# Patient Record
Sex: Female | Born: 2000 | Race: White | Hispanic: No | Marital: Married | Smoking: Never smoker
Health system: Southern US, Community
[De-identification: ages and names within clinical notes are randomized; demographics above are authoritative.]

## PROBLEM LIST (undated history)

## (undated) DIAGNOSIS — K5090 Crohn's disease, unspecified, without complications: Secondary | ICD-10-CM

## (undated) DIAGNOSIS — J45909 Unspecified asthma, uncomplicated: Secondary | ICD-10-CM

## (undated) DIAGNOSIS — F32A Depression, unspecified: Secondary | ICD-10-CM

## (undated) DIAGNOSIS — J302 Other seasonal allergic rhinitis: Secondary | ICD-10-CM

## (undated) DIAGNOSIS — H579 Unspecified disorder of eye and adnexa: Secondary | ICD-10-CM

## (undated) DIAGNOSIS — T7840XA Allergy, unspecified, initial encounter: Secondary | ICD-10-CM

## (undated) DIAGNOSIS — F419 Anxiety disorder, unspecified: Secondary | ICD-10-CM

## (undated) DIAGNOSIS — J45990 Exercise induced bronchospasm: Secondary | ICD-10-CM

## (undated) HISTORY — DX: Anxiety disorder, unspecified: F41.9

## (undated) HISTORY — DX: Other seasonal allergic rhinitis: J30.2

## (undated) HISTORY — PX: TOOTH EXTRACTION: SUR596

## (undated) HISTORY — DX: Depression, unspecified: F32.A

## (undated) HISTORY — DX: Exercise induced bronchospasm: J45.990

## (undated) HISTORY — DX: Unspecified disorder of eye and adnexa: H57.9

## (undated) HISTORY — DX: Unspecified asthma, uncomplicated: J45.909

## (undated) HISTORY — DX: Allergy, unspecified, initial encounter: T78.40XA

---
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
## 2019-01-23 ENCOUNTER — Other Ambulatory Visit: Payer: Self-pay

## 2019-01-23 DIAGNOSIS — Z20822 Contact with and (suspected) exposure to covid-19: Secondary | ICD-10-CM

## 2019-01-24 LAB — NOVEL CORONAVIRUS, NAA: SARS-CoV-2, NAA: NOT DETECTED

## 2019-01-25 ENCOUNTER — Telehealth: Payer: Self-pay | Admitting: *Deleted

## 2019-01-25 NOTE — Telephone Encounter (Signed)
Patient given negative covid results .

## 2019-02-06 ENCOUNTER — Ambulatory Visit: Payer: 59 | Attending: Internal Medicine

## 2019-02-06 DIAGNOSIS — Z20822 Contact with and (suspected) exposure to covid-19: Secondary | ICD-10-CM

## 2019-02-08 LAB — NOVEL CORONAVIRUS, NAA: SARS-CoV-2, NAA: NOT DETECTED

## 2019-04-28 ENCOUNTER — Ambulatory Visit: Payer: 59 | Attending: Internal Medicine

## 2019-04-28 DIAGNOSIS — Z23 Encounter for immunization: Secondary | ICD-10-CM

## 2019-04-28 NOTE — Progress Notes (Signed)
   Covid-19 Vaccination Clinic  Name:  Shelly Sanchez    MRN: [id number] DOB: 06-24-2000  04/28/2019  [name] was observed post Covid-19 immunization for 15 minutes without incident. She was provided with Vaccine Information Sheet and instruction to access the V-Safe system.   Ms. Common was instructed to call 911 with any severe reactions post vaccine: Marland Kitchen Difficulty breathing  . Swelling of face and throat  . A fast heartbeat  . A bad rash all over body  . Dizziness and weakness   Immunizations Administered    Name Date Dose VIS Date Route   Pfizer COVID-19 Vaccine 04/28/2019  9:28 AM 0.3 mL 01/20/2019 Intramuscular   Manufacturer: [place]   Lot: [id number]   [place]: [id number]

## 2019-05-23 ENCOUNTER — Ambulatory Visit: Payer: 59 | Attending: Internal Medicine

## 2019-05-23 DIAGNOSIS — Z23 Encounter for immunization: Secondary | ICD-10-CM

## 2019-05-23 NOTE — Progress Notes (Signed)
   Covid-19 Vaccination Clinic  Name:  ZEENAT JEANBAPTISTE    MRN: [id number] DOB: 2001/02/06  05/23/2019  Ms. Anzalone was observed post Covid-19 immunization for 15 minutes without incident. She was provided with Vaccine Information Sheet and instruction to access the V-Safe system.   Ms. Volkov was instructed to call 911 with any severe reactions post vaccine: Marland Kitchen Difficulty breathing  . Swelling of face and throat  . A fast heartbeat  . A bad rash all over body  . Dizziness and weakness   Immunizations Administered    Name Date Dose VIS Date Route   Pfizer COVID-19 Vaccine 05/23/2019 10:10 AM 0.3 mL 01/20/2019 Intramuscular   Manufacturer: [place]   Lot: [id number]   [place]: [id number]

## 2019-09-06 ENCOUNTER — Encounter: Payer: Self-pay | Admitting: Internal Medicine

## 2019-09-06 ENCOUNTER — Other Ambulatory Visit: Payer: Self-pay

## 2019-09-06 ENCOUNTER — Telehealth (INDEPENDENT_AMBULATORY_CARE_PROVIDER_SITE_OTHER): Payer: 59 | Admitting: Internal Medicine

## 2019-09-06 VITALS — Ht 62.0 in | Wt 155.0 lb

## 2019-09-06 DIAGNOSIS — K5090 Crohn's disease, unspecified, without complications: Secondary | ICD-10-CM

## 2019-09-06 DIAGNOSIS — F418 Other specified anxiety disorders: Secondary | ICD-10-CM | POA: Diagnosis not present

## 2019-09-06 NOTE — Progress Notes (Signed)
Virtual Visit via Video Note  I connected with@ on 09/06/19 at  9:30 AM EDT by a video enabled telemedicine application and verified that I am speaking with the correct person using two identifiers. Location patient: home Location provider:work office Persons participating in the virtual visit: patient, provider  [place] national recommendations  regarding COVID 19 pandemic   video visit is advised over in office visit for this patient.  Patient aware  of the limitations of evaluation and management by telemedicine and  availability of in person appointments. and agreed to proceed.   HPI: Shelly Sanchez presents for video visit about increasing anxiety related to her health  She was hospitalized for severe life threatening illness 2019   [place]   Has Crohn in remission  Has had the covid vaccine x 2 but had sever anxiety reaction  About putting  Med substances  in her body  With the injections.   Her sx seem to be escalating  And fear for  Health reactions   Back to school this fall . [employer]  There are counselors on campus.  Health otherwise stable  Did have counseling soon after  Hospitalization    ROS: See pertinent positives and negatives per HPI.  Past Medical History:  Diagnosis Date  . Crohn disease ([place])   . Exercise induced bronchospasm    inhaler prn.   . 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 Seasonal allergies     History reviewed. No pertinent surgical history.  Family History  Problem Relation Age of Onset  . ADD / ADHD Other        brother and father  . Diabetes Other   . Scoliosis Other   . Other Sister        browns syndrome  . Seizures Sister        occipital pcs  . Hashimoto's thyroiditis Sister     Social History   Tobacco Use  . Smoking status: Never Smoker  . Smokeless tobacco: Never Used  Vaping Use  . Vaping Use: Never used  Substance Use Topics  . Alcohol use: Not Currently  . Drug use: Never       Current Outpatient Medications:  .  acetaminophen (TYLENOL) 325 MG tablet, Take by mouth., Disp: , Rfl:  .  fluticasone (FLONASE) 50 MCG/ACT nasal spray, Place 1 spray into both nostrils as needed. , Disp: , Rfl:  .  loratadine-pseudoephedrine (CLARITIN-D 12-HOUR) 5-120 MG tablet, Take 1 tablet by mouth 2 (two) times daily as needed for allergies., Disp: , Rfl:  .  Melatonin 10 MG CAPS, Take by mouth., Disp: , Rfl:  .  Multiple Vitamins-Minerals ([employer] WOMENS DAILY GUMMIES) CHEW, Chew 2 tablets by mouth daily., Disp: , Rfl:  .  PROAIR HFA 108 (90 Base) MCG/ACT inhaler, USE 2 PUFFS BEFORE EXERCISE OR EVERY 6 HOURS AS NEEDED, Disp: 1 Inhaler, Rfl: 0 .  amoxicillin-clavulanate (AUGMENTIN) 875-125 MG tablet, Take 1 tablet by mouth every 12 (twelve) hours., Disp: 14 tablet, Rfl: 0 .  Pediatric Multiple Vit-C-FA (CHEWABLE VITE CHILDRENS) CHEW, Chew 1 tablet by mouth daily. (Patient not taking: Reported on 09/06/2019), Disp: , Rfl:   EXAM: BP Readings from Last 3 Encounters:  02/15/18 120/68  08/17/17 99/67  06/19/17 96/64    VITALS per patient if applicable:  GENERAL: alert, oriented, appears well and in no acute distress  HEENT: atraumatic, conjunttiva clear, no obvious abnormalities on inspection of  external nose and ears  NECK: normal movements of the head and neck  LUNGS: on inspection no signs of respiratory distress, breathing rate appears normal, no obvious gross SOB, gasping or wheezing  CV: no obvious cyanosis  MS: moves all visible extremities without noticeable abnormality  PSYCH/NEURO: pleasant and cooperative, no obvious depression or anxiety, speech and thought processing grossly intact Lab Results  Component Value Date   WBC 7.6 07/18/2018   HGB 14.4 07/18/2018   HCT 44 07/18/2018   PLT 202 07/18/2018   GLUCOSE 95 08/14/2014   CHOL 176 08/14/2014   TRIG 127.0 08/14/2014   HDL 47.30 08/14/2014   LDLCALC 103 (H) 08/14/2014   ALT 16 07/18/2018   AST 18  07/18/2018   NA 138 07/18/2018   K 4.6 07/18/2018   CL 103 08/14/2014   CREATININE 0.8 07/18/2018   BUN 11 07/18/2018   CO2 29 08/14/2014   TSH 1.29 08/14/2014    ASSESSMENT AND PLAN:  Discussed the following assessment and plan:    ICD-10-CM   1. Situational anxiety  F41.8   2. Crohn's disease in remission ([place])  K50.90   sounds like ptsd  Catastrophic   Thinking  Understandable after   Life threatening  Illness and hospitalization   Livable events . At this time dont concern about a booster  Or the flu vaccine . Glad she already got the covid vaccine . Set up counseling  On campus ( school starts next month)   Counseled.  She Will set  Up with counseling for fall smemster  Counseling center [employer]  And get back with me either message or vVV after establised   Will s upport  if need medicaiton help  While in therapy.  CBT agree .  Consider medication  If indicated   Expectant management and discussion of plan and treatment with opportunity to ask questions and all were answered. The patient agreed with the plan and demonstrated an understanding of the instructions.   Advised to call back or seek an in-person evaluation if worsening  or having  further concerns . Return for after counselingi n school or as needed if medication  indicated .  32 minutes  Review and  Counseling planning   Shanon Ace, MD

## 2019-09-25 IMAGING — MR MR [PERSON_NAME] ABD W/CM
15 series · 48 of 48 positions shown · IV contrast (multihance)
Comparison: None.

CLINICAL DATA: Crohn's disease. Lower abdominal, pelvic, and back
pain. Bloating.

EXAM:
MR ABDOMEN AND PELVIS WITHOUT AND WITH CONTRAST (MR ENTEROGRAPHY)
TECHNIQUE: Multiplanar, multisequence MRI of the abdomen and pelvis was
performed both before and during bolus administration of intravenous
contrast. Negative oral contrast VoLumen was given.
CONTRAST:  12mL MULTIHANCE GADOBENATE DIMEGLUMINE 529 MG/ML IV SOLN

[Series 3: T2 · coronal · 4.0mm · 1.56mm/px · 2 of 38 slices shown (1 of 2)]
[im 1/38]
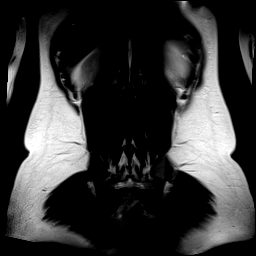
[im 38/38]
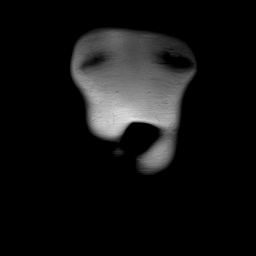

[Series 4: T2 · axial · 6.0mm · 1.22mm/px · z∈[-184,+184]mm · 3 of 50 slices shown (2 of 2)]
[im 1/50]
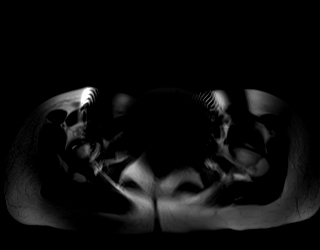
[im 25/50]
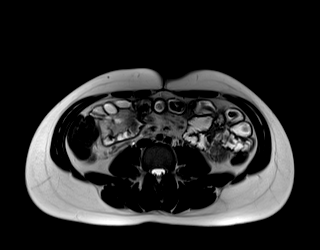
[im 50/50]
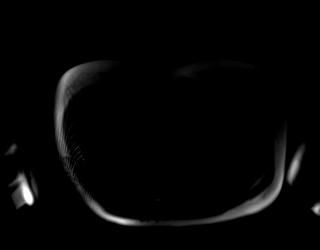

[Series 5: DWI · axial · 6.0mm · 1.42mm/px · z∈[-184,+184]mm · 7 of 150 slices shown (1 of 2)]
[im 1/150]
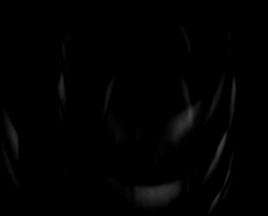
[im 25/150]
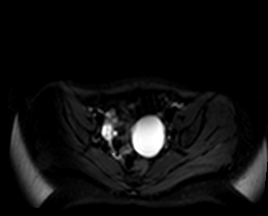
[im 50/150]
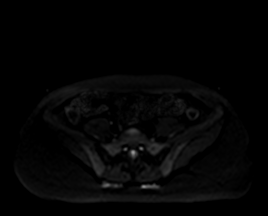
[im 75/150]
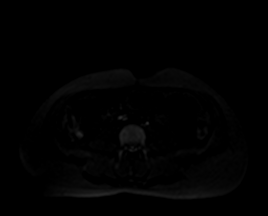
[im 100/150]
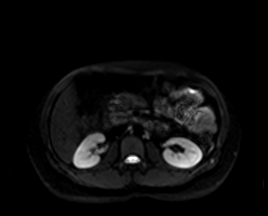
[im 125/150]
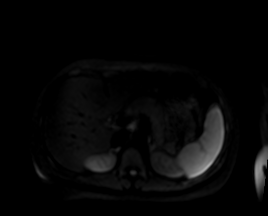
[im 150/150]
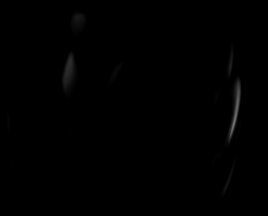

[Series 6: DWI · axial · 6.0mm · 1.42mm/px · z∈[-184,+184]mm · 3 of 50 slices shown (2 of 2)]
[im 1/50]
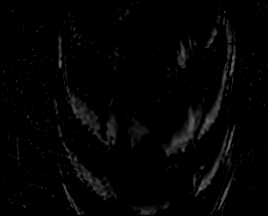
[im 25/50]
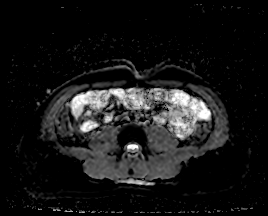
[im 50/50]
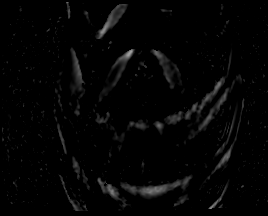

[Series 7: bSSFP · coronal · 4.0mm · 1.25mm/px · 1 of 38 slices shown]
[im 1/38]
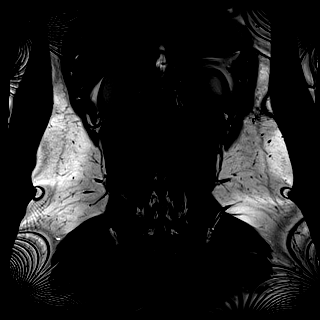

[Series 8: T1 dynamic · axial · non-contrast · 4.0mm · 1.25mm/px · z∈[-174,+174]mm · 3 of 88 slices shown]
[im 1/88]
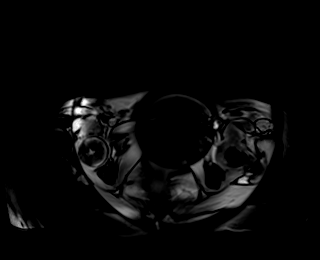
[im 44/88]
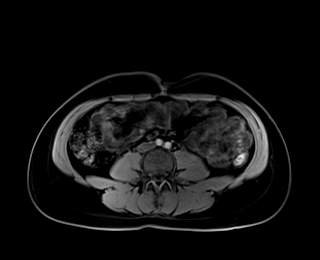
[im 88/88]
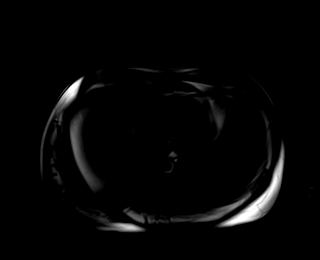

[Series 9: T1 dynamic post-contrast · axial · 4.0mm · 1.25mm/px · z∈[-174,+174]mm · 3 of 88 slices shown (1 of 9)]
[im 1/88]
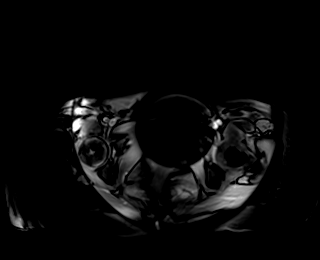
[im 44/88]
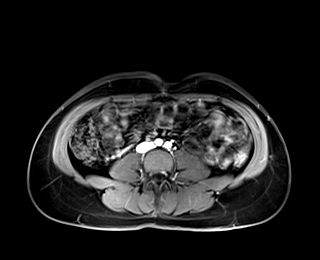
[im 88/88]
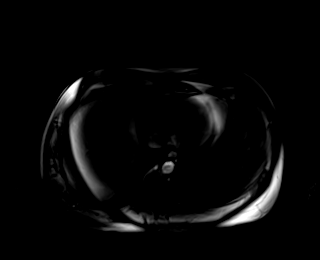

[Series 10: T1 dynamic post-contrast · axial · 4.0mm · 1.25mm/px · z∈[-174,+174]mm · 3 of 88 slices shown (2 of 9)]
[im 1/88]
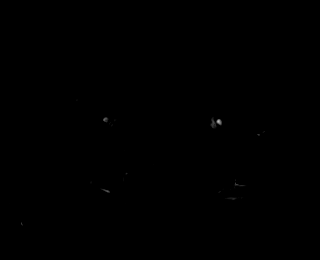
[im 44/88]
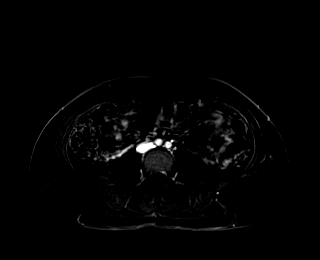
[im 88/88]
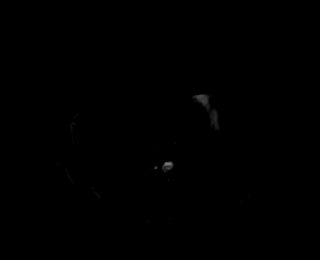

[Series 11: T1 dynamic post-contrast · axial · 4.0mm · 1.25mm/px · z∈[-174,+174]mm · 3 of 88 slices shown (3 of 9)]
[im 1/88]
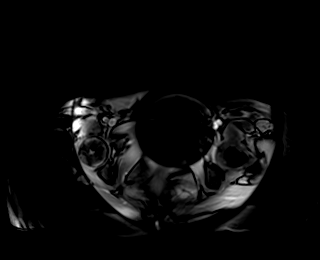
[im 44/88]
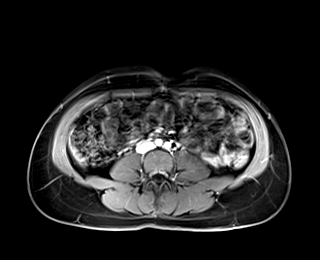
[im 88/88]
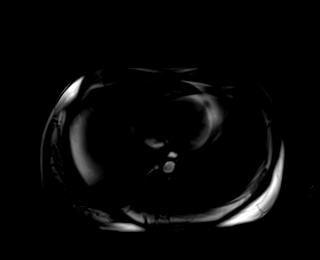

[Series 12: T1 dynamic post-contrast · axial · 4.0mm · 1.25mm/px · z∈[-174,+174]mm · 3 of 88 slices shown (4 of 9)]
[im 1/88]
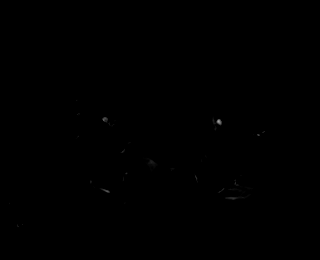
[im 44/88]
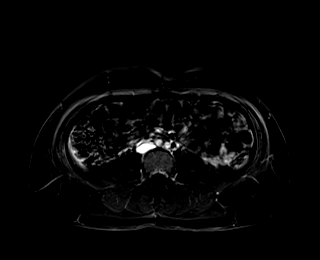
[im 88/88]
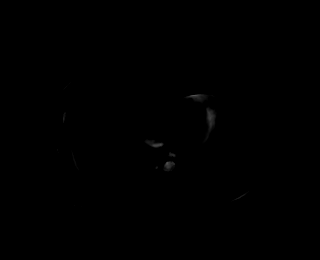

[Series 13: T1 dynamic post-contrast · axial · 4.0mm · 1.25mm/px · z∈[-174,+174]mm · 3 of 88 slices shown (5 of 9)]
[im 1/88]
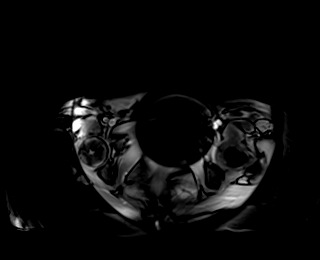
[im 44/88]
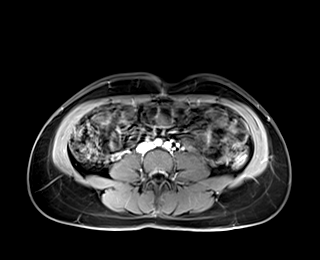
[im 88/88]
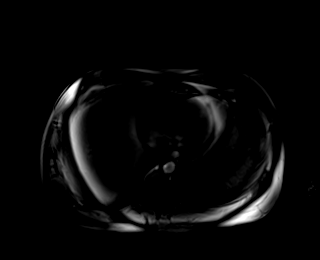

[Series 14: T1 dynamic post-contrast · axial · 4.0mm · 1.25mm/px · z∈[-174,+174]mm · 3 of 88 slices shown (6 of 9)]
[im 1/88]
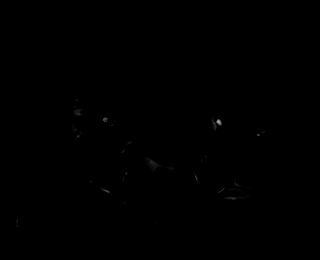
[im 44/88]
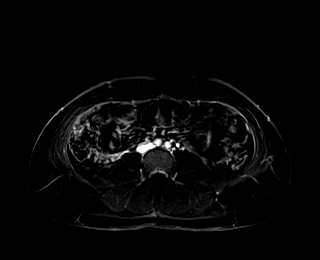
[im 88/88]
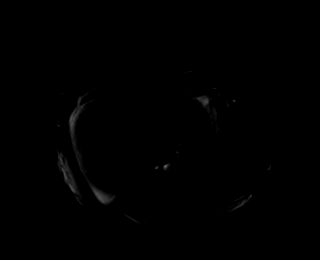

[Series 15: T1 dynamic post-contrast · coronal · 3.0mm · 1.38mm/px · 5 of 144 slices shown (7 of 9)]
[im 1/144]
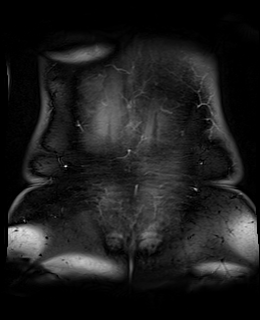
[im 36/144]
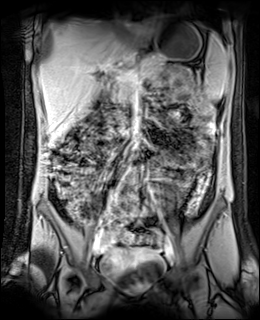
[im 72/144]
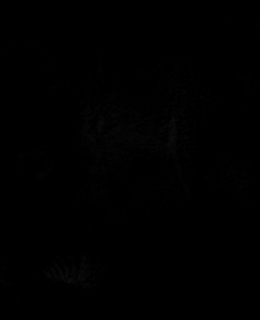
[im 108/144]
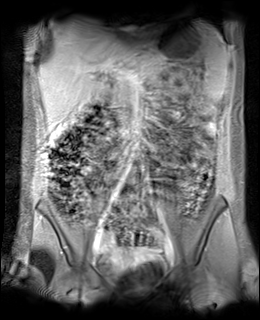
[im 144/144]
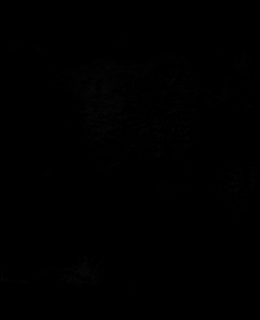

[Series 16: T1 dynamic post-contrast · axial · 4.0mm · 1.25mm/px · z∈[-174,+174]mm · 3 of 88 slices shown (8 of 9)]
[im 1/88]
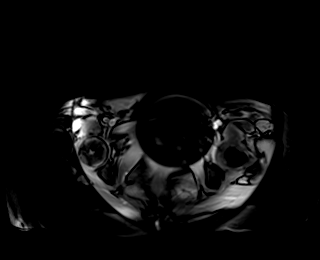
[im 44/88]
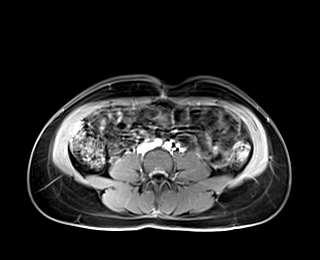
[im 88/88]
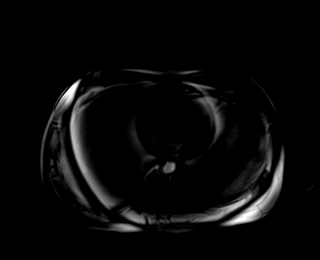

[Series 17: T1 dynamic post-contrast · axial · 4.0mm · 1.25mm/px · z∈[-174,+174]mm · 3 of 88 slices shown (9 of 9)]
[im 1/88]
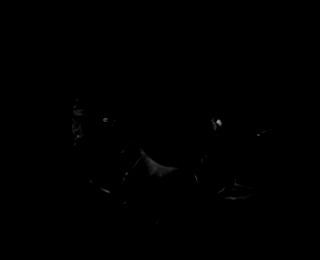
[im 44/88]
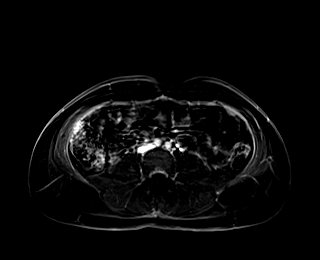
[im 88/88]
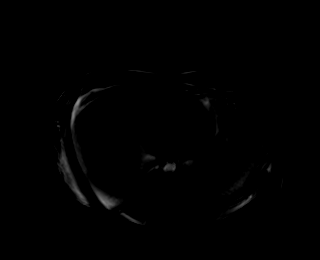

[48 of 48 positions shown; findings below may reference images not displayed]

FINDINGS: COMBINED FINDINGS FOR BOTH MR ABDOMEN AND PELVIS

Lower Chest: No acute findings.

Hepatobiliary: No hepatic masses identified. Gallbladder is
unremarkable. No evidence of biliary ductal dilatation.

Pancreas:  No mass or inflammatory changes.

Spleen: Within normal limits in size and appearance.

Adrenals/Urinary Tract: No masses identified. No evidence of
hydronephrosis.

Stomach/Bowel: No evidence of small bowel wall thickening, abnormal
contrast enhancement, or dilatation. No evidence of mesenteric
inflammatory changes, enteric fistula, or abnormal fluid
collections. The terminal ileum is normal in appearance. No other
areas of bowel wall thickening identified.

Vascular/Lymphatic: No pathologically enlarged lymph nodes. No
abdominal aortic aneurysm.

Reproductive: Normal appearance of uterus. A simple appearing cyst
is seen in the left adnexa which measures 5.1 x 4.3 cm, and has
benign characteristics. No other pelvic mass identified. No evidence
of inflammatory changes or free fluid.

Other:  None.

Musculoskeletal:  No suspicious bone lesions identified.
IMPRESSION: No radiographic evidence of inflammatory bowel disease.

5.1 cm benign-appearing left adnexal cyst, likely physiologic in a
reproductive age female. Given size of this cyst, follow-up by
transvaginal pelvic ultrasound is recommended in 6-12 weeks. This
recommendation follows ACR consensus guidelines: White Paper of the
ACR Incidental Findings Committee II on Adnexal Findings. [HOSPITAL] [DATE].

## 2019-11-28 ENCOUNTER — Telehealth (INDEPENDENT_AMBULATORY_CARE_PROVIDER_SITE_OTHER): Payer: 59 | Admitting: Internal Medicine

## 2019-11-28 ENCOUNTER — Encounter: Payer: Self-pay | Admitting: Internal Medicine

## 2019-11-28 DIAGNOSIS — Z2089 Contact with and (suspected) exposure to other communicable diseases: Secondary | ICD-10-CM

## 2019-11-28 DIAGNOSIS — Z207 Contact with and (suspected) exposure to pediculosis, acariasis and other infestations: Secondary | ICD-10-CM

## 2019-11-28 MED ORDER — PERMETHRIN 5 % EX CREA
TOPICAL_CREAM | CUTANEOUS | 1 refills | Status: DC
Start: 2019-11-28 — End: 2020-04-16

## 2019-11-28 NOTE — Progress Notes (Signed)
Virtual Visit via Video Note  I connected with@ on 11/28/19 at  4:00 PM EDT by a video enabled telemedicine application and verified that I am speaking with the correct person using two identifiers. Location patient: school  Location provider:work office Persons participating in the virtual visit: patient, provider  [employer] national recommendations  regarding COVID 19 pandemic   video visit is advised over in office visit for this patient.  Patient aware  of the limitations of evaluation and management by telemedicine and  availability of in person appointments. and agreed to proceed.   HPI: Shelly Sanchez presents for video visit Was close contact with family  Over break  October 9-11   Now diagnosis ed wiuth scabies  Baby and more in family  In last few days and luna and father and  All had itching and  Scabies.  Family has been rx with Elimite but she has not  She has no itching  All over  Or rash  Some itching ankles walking through grass on campus.  Not sure if bf exposed   Can we also give enough for him in case ?     ROS: See pertinent positives and negatives per HPI.  Past Medical History:  Diagnosis Date  . Crohn disease ([place])   . Exercise induced bronchospasm    inhaler prn.   . 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 Seasonal allergies     History reviewed. No pertinent surgical history.  Family History  Problem Relation Age of Onset  . ADD / ADHD Other        brother and father  . Diabetes Other   . Scoliosis Other   . Other Sister        browns syndrome  . Seizures Sister        occipital pcs  . Hashimoto's thyroiditis Sister     Social History   Tobacco Use  . Smoking status: Never Smoker  . Smokeless tobacco: Never Used  Vaping Use  . Vaping Use: Never used  Substance Use Topics  . Alcohol use: Not Currently  . Drug use: Never      Current Outpatient Medications:  .  acetaminophen (TYLENOL) 325 MG tablet, Take  by mouth., Disp: , Rfl:  .  loratadine-pseudoephedrine (CLARITIN-D 12-HOUR) 5-120 MG tablet, Take 1 tablet by mouth 2 (two) times daily as needed for allergies., Disp: , Rfl:  .  Melatonin 10 MG CAPS, Take by mouth., Disp: , Rfl:  .  Multiple Vitamins-Minerals ([employer] WOMENS DAILY GUMMIES) CHEW, Chew 2 tablets by mouth daily., Disp: , Rfl:  .  fluticasone (FLONASE) 50 MCG/ACT nasal spray, Place 1 spray into both nostrils as needed.  (Patient not taking: Reported on 11/28/2019), Disp: , Rfl:  .  permethrin (ELIMITE) 5 % cream, Apply 5% cream to entire body from the neck down,  wash after 8 to 14 hours can repeat in 14 days, Disp: 120 g, Rfl: 1 .  PROAIR HFA 108 (90 Base) MCG/ACT inhaler, USE 2 PUFFS BEFORE EXERCISE OR EVERY 6 HOURS AS NEEDED (Patient not taking: Reported on 11/28/2019), Disp: 1 Inhaler, Rfl: 0  EXAM: BP Readings from Last 3 Encounters:  02/15/18 120/68  08/17/17 99/67  06/19/17 96/64    VITALS per patient if applicable:  GENERAL: alert, oriented, appears well and in no acute distress HEENT: atraumatic, conjunttiva clear, no obvious abnormalities on inspection of external nose and ears  NECK: normal movements of the head and neck LUNGS: on inspection no signs of respiratory distress, breathing rate appears normal, no obvious gross SOB, gasping or wheezing CV: no obvious cyanosis PSYCH/NEURO: pleasant and cooperative, no obvious depression or anxiety, speech and thought processing grossly intact Lab Results  Component Value Date   WBC 7.6 07/18/2018   HGB 14.4 07/18/2018   HCT 44 07/18/2018   PLT 202 07/18/2018   GLUCOSE 95 08/14/2014   CHOL 176 08/14/2014   TRIG 127.0 08/14/2014   HDL 47.30 08/14/2014   LDLCALC 103 (H) 08/14/2014   ALT 16 07/18/2018   AST 18 07/18/2018   NA 138 07/18/2018   K 4.6 07/18/2018   CL 103 08/14/2014   CREATININE 0.8 07/18/2018   BUN 11 07/18/2018   CO2 29 08/14/2014   TSH 1.29 08/14/2014    ASSESSMENT AND PLAN:  Discussed the  following assessment and plan:    ICD-10-CM   1. Scabies exposure  Z20.89     Counseled.  agreee with prophylactic rx   Refill given  Enough for  contact  Expectant management and discussion of plan and treatment with opportunity to ask questions and all were answered. The patient agreed with the plan and demonstrated an understanding of the instructions.  rx send to  pharmacy in [place] to call back or seek an in-person evaluation if worsening  or having  further concerns . Return for as needed . Marland Kitchen   Shanon Ace, MD

## 2020-02-20 ENCOUNTER — Encounter: Payer: Self-pay | Admitting: Internal Medicine

## 2020-02-20 NOTE — Progress Notes (Signed)
Quantiferon TB gold plus-negative on 01/29/2020-per fax from [facility].

## 2020-04-16 ENCOUNTER — Other Ambulatory Visit: Payer: Self-pay

## 2020-04-16 ENCOUNTER — Encounter: Payer: Self-pay | Admitting: Internal Medicine

## 2020-04-16 ENCOUNTER — Telehealth: Payer: 59 | Admitting: Internal Medicine

## 2020-04-16 VITALS — Ht 62.0 in | Wt 180.0 lb

## 2020-04-16 DIAGNOSIS — Z8616 Personal history of COVID-19: Secondary | ICD-10-CM

## 2020-04-16 DIAGNOSIS — J329 Chronic sinusitis, unspecified: Secondary | ICD-10-CM | POA: Diagnosis not present

## 2020-04-16 DIAGNOSIS — J3489 Other specified disorders of nose and nasal sinuses: Secondary | ICD-10-CM

## 2020-04-16 MED ORDER — AMOXICILLIN-POT CLAVULANATE 875-125 MG PO TABS: 1.0000 | ORAL_TABLET | Freq: Two times a day (BID) | ORAL | 0 refills | Status: DC

## 2020-04-16 NOTE — Progress Notes (Signed)
Virtual Visit via Video Note  I connected with@ on 04/16/20 at  1:30 PM EST by a video enabled telemedicine application and verified that I am speaking with the correct person using two identifiers. Location patient: home Location provider:work  office Persons participating in the virtual visit: patient, provider  [employer] national recommendations  regarding COVID 19 pandemic   video visit is advised over in office visit for this patient.  Patient aware  of the limitations of evaluation and management by telemedicine and  availability of in person appointments. and agreed to proceed.   HPI: Shelly Sanchez presents for video visit after having COVID-19 infection early in January 2022 is had continued facial sinus pressure that is unrelenting.  She was expecting it to left after using saline Afrin other over-the-counter's Claritin without help.   Initial Covid symptoms were relentless congestion and no cough    .    Fever and fatigue .   Taste and smell loss recoveredabout a month back    Describes severe pressure in the triangle of her face over maxillary and ethmoid areas.  Some drainage especially in the morning but not a lot otherwise no bleeding fever or chills equilibrium mess ed  Up no falling but a little bit dizzy. Taking hydroxizine    And vitamin s Hearing feels     clogged and   Vision ok .  Past history of sinusitis. May have some seasonal allergies but not recently has not been using Flonase recently. ROS: See pertinent positives and negatives per HPI.  Past Medical History:  Diagnosis Date  . Crohn disease ([place])   . Exercise induced bronchospasm    inhaler prn.   . 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 Seasonal allergies     History reviewed. No pertinent surgical history.  Family History  Problem Relation Age of Onset  . ADD / ADHD Other        brother and father  . Diabetes Other   . Scoliosis Other   . Other Sister         browns syndrome  . Seizures Sister        occipital pcs  . Hashimoto's thyroiditis Sister     Social History   Tobacco Use  . Smoking status: Never Smoker  . Smokeless tobacco: Never Used  Vaping Use  . Vaping Use: Never used  Substance Use Topics  . Alcohol use: Not Currently  . Drug use: Never      Current Outpatient Medications:  .  acetaminophen (TYLENOL) 325 MG tablet, Take by mouth., Disp: , Rfl:  .  amoxicillin-clavulanate (AUGMENTIN) 875-125 MG tablet, Take 1 tablet by mouth every 12 (twelve) hours. For sinusitis, Disp: 14 tablet, Rfl: 0 .  Cholecalciferol (VITAMIN D) 50 MCG (2000 UT) tablet, , Disp: , Rfl:  .  Cranberry-Vitamin C (CRANBERRY CONCENTRATE/VITAMINC) 15000-100 MG CAPS, , Disp: , Rfl:  .  hydrOXYzine (VISTARIL) 25 MG capsule, Take 25 mg by mouth 2 (two) times daily as needed., Disp: , Rfl:  .  loratadine-pseudoephedrine (CLARITIN-D 12-HOUR) 5-120 MG tablet, Take 1 tablet by mouth 2 (two) times daily as needed for allergies., Disp: , Rfl:  .  Magnesium 400 MG CAPS, , Disp: , Rfl:  .  Melatonin 10 MG CAPS, Take by mouth., Disp: , Rfl:  .  Multiple Vitamins-Minerals ([employer] WOMENS DAILY GUMMIES) CHEW, Chew 2 tablets by mouth daily., Disp: , Rfl:  .  Omega-3 Fatty Acids (FISH OIL MAXIMUM STRENGTH PO), , Disp: , Rfl:  .  Vitamin E 180 MG (400 UNIT) CAPS, , Disp: , Rfl:  .  fluticasone (FLONASE) 50 MCG/ACT nasal spray, Place 1 spray into both nostrils as needed.  (Patient not taking: Reported on 04/16/2020), Disp: , Rfl:  .  PROAIR HFA 108 (90 Base) MCG/ACT inhaler, USE 2 PUFFS BEFORE EXERCISE OR EVERY 6 HOURS AS NEEDED (Patient not taking: No sig reported), Disp: 1 Inhaler, Rfl: 0  EXAM: BP Readings from Last 3 Encounters:  02/15/18 120/68  08/17/17 99/67  06/19/17 96/64    VITALS per patient if applicable:  GENERAL: alert, oriented, appears well and in no acute distress looks mildly congested but no cough sneezing during exam no facial edema.  HEENT:  atraumatic, conjunttiva clear, no obvious abnormalities on inspection of external nose and ears Points to areas maxillary ethmoid as area of discomfort. NECK: normal movements of the head and neck  LUNGS: on inspection no signs of respiratory distress, breathing rate appears normal, no obvious gross SOB, gasping or wheezing  CV: no obvious cyanosis  MS: moves all visible extremities without noticeable abnormality  PSYCH/NEURO: pleasant and cooperative, no obvious depression or anxiety, speech and thought processing grossly intact Lab Results  Component Value Date   WBC 7.6 07/18/2018   HGB 14.4 07/18/2018   HCT 44 07/18/2018   PLT 202 07/18/2018   GLUCOSE 95 08/14/2014   CHOL 176 08/14/2014   TRIG 127.0 08/14/2014   HDL 47.30 08/14/2014   LDLCALC 103 (H) 08/14/2014   ALT 16 07/18/2018   AST 18 07/18/2018   NA 138 07/18/2018   K 4.6 07/18/2018   CL 103 08/14/2014   CREATININE 0.8 07/18/2018   BUN 11 07/18/2018   CO2 29 08/14/2014   TSH 1.29 08/14/2014    ASSESSMENT AND PLAN:  Discussed the following assessment and plan:    ICD-10-CM   1. Sinus pressure  J34.89   2. Sinusitis, unspecified chronicity, unspecified location  J32.9   3. History of COVID-19  Z86.16     Counseled.  Medicines may contribute to headache wooziness however persistent localized sinus symptoms after recovery from other from Covid 2 months ago it is reasonable to add antibiotic at this time.  Suggest try adding the Flonase because of allergy season and the saline. Follow-up information if persistent progressive despite treatment.  Expectant management and discussion of plan and treatment with opportunity to ask questions and all were answered. The patient agreed with the plan and demonstrated an understanding of the instructions.   Advised to call back or seek an in-person evaluation if worsening  or having  further concerns . Return if symptoms worsen or fail to improve as expected.    Shanon Ace, MD

## 2020-04-16 NOTE — Progress Notes (Signed)
Onset of symptoms 2 months ago and Patient was positive for Covid then. Sinus pressure started then but has not gone away.

## 2020-07-18 DIAGNOSIS — J0391 Acute recurrent tonsillitis, unspecified: Secondary | ICD-10-CM | POA: Insufficient documentation

## 2020-12-11 LAB — HEPATIC FUNCTION PANEL
Alkaline Phosphatase: 124 (ref 25–125)
Bilirubin, Total: 0.6

## 2020-12-11 LAB — BASIC METABOLIC PANEL
BUN: 13 (ref 4–21)
CO2: 28 — AB (ref 13–22)
Chloride: 101 (ref 99–108)
Creatinine: 0.7 (ref 0.5–1.1)
Glucose: 86
Potassium: 4 (ref 3.4–5.3)
Sodium: 137 (ref 137–147)

## 2020-12-11 LAB — COMPREHENSIVE METABOLIC PANEL
Albumin: 4.5 (ref 3.5–5.0)
Calcium: 9.2 (ref 8.7–10.7)

## 2020-12-11 LAB — CBC AND DIFFERENTIAL
Neutrophils Absolute: 6.1
Platelets: 237 (ref 150–399)

## 2020-12-17 ENCOUNTER — Encounter: Payer: Self-pay | Admitting: Internal Medicine

## 2020-12-18 NOTE — Telephone Encounter (Signed)
What CO2 you are discussing ?  t appears that you had a chemistry panel with a mildly elevated CO2 which is a calculated acid-base level and has very little to do with your breathing CO2. You may want to ask whoever ordered the test also but I think it is clinically insignificant From what I can tell.

## 2021-01-10 LAB — HEPATIC FUNCTION PANEL
ALT: 22 (ref 7–35)
AST: 16 (ref 13–35)
Alkaline Phosphatase: 111 (ref 25–125)
Bilirubin, Total: 0.4

## 2021-01-10 LAB — BASIC METABOLIC PANEL
BUN: 11 (ref 4–21)
CO2: 26 — AB (ref 13–22)
Chloride: 106 (ref 99–108)
Glucose: 84
Potassium: 4.3 (ref 3.4–5.3)
Sodium: 142 (ref 137–147)

## 2021-01-10 LAB — COMPREHENSIVE METABOLIC PANEL
Albumin: 4.6 (ref 3.5–5.0)
Calcium: 9.6 (ref 8.7–10.7)

## 2021-01-10 LAB — CBC: RBC: 5.17 — AB (ref 3.87–5.11)

## 2021-01-10 LAB — CBC AND DIFFERENTIAL
HCT: 14 — AB (ref 36–46)
WBC: 9.1

## 2021-01-28 ENCOUNTER — Encounter: Payer: Self-pay | Admitting: Internal Medicine

## 2021-01-29 ENCOUNTER — Encounter: Payer: Self-pay | Admitting: Internal Medicine

## 2021-02-07 NOTE — Telephone Encounter (Signed)
No serious abnormality on lab   I dont see anemia   and no sig worries  Just keep upcoming appt woth /s

## 2021-02-12 ENCOUNTER — Encounter: Payer: Self-pay | Admitting: Internal Medicine

## 2021-02-12 ENCOUNTER — Telehealth: Payer: Self-pay

## 2021-02-12 ENCOUNTER — Telehealth (INDEPENDENT_AMBULATORY_CARE_PROVIDER_SITE_OTHER): Payer: 59 | Admitting: Internal Medicine

## 2021-02-12 DIAGNOSIS — Z8616 Personal history of COVID-19: Secondary | ICD-10-CM

## 2021-02-12 DIAGNOSIS — M2550 Pain in unspecified joint: Secondary | ICD-10-CM | POA: Diagnosis not present

## 2021-02-12 DIAGNOSIS — K5090 Crohn's disease, unspecified, without complications: Secondary | ICD-10-CM

## 2021-02-12 DIAGNOSIS — R4189 Other symptoms and signs involving cognitive functions and awareness: Secondary | ICD-10-CM

## 2021-02-12 DIAGNOSIS — R7982 Elevated C-reactive protein (CRP): Secondary | ICD-10-CM

## 2021-02-12 DIAGNOSIS — R748 Abnormal levels of other serum enzymes: Secondary | ICD-10-CM

## 2021-02-12 NOTE — Progress Notes (Addendum)
Virtual Visit via Video Note  I connected with [place] on 02/12/21 at 12:30 PM EST by a video enabled telemedicine application and verified that I am speaking with the correct person using two identifiers. Location patient: home Location provider:work  office Persons participating in the virtual visit: patient, provider  [place] national recommendations  regarding COVID 19 pandemic   video visit is advised over in office visit for this patient.  Patient aware  of the limitations of evaluation and management by telemedicine and  availability of in person appointments. and agreed to proceed.   HPI: Shelly Sanchez presents for video visit technical difficulties change this to a telephone visit.  After attempting to do connection for 20 minutes. She is under care from pediatric GI [facility] for Crohn's disease which is in remission.  She has had COVID about a year ago and feels that she has had concentration brain fog difficulty since that time.  She has seen a psychiatrist put on SSRI without help. Her GI team has followed an elevated CRP and at this time feels that her Crohn's is quiescent and there needs to be evaluation for other causes of her lab abnormalities and the way she feels.  She does endorse joint pains without swelling but no fevers.  Gets red cheeks after she eats but no itching or swelling. Takes B12 last B12 level was a year ago. Denies symptoms of sleep apnea does have awakenings but not told she snores or stops breathing.   ROS: See pertinent positives and negatives per HPI.  Past Medical History:  Diagnosis Date   Crohn disease ([place])    Exercise induced bronchospasm    inhaler prn.    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 No pertinent surgical history.  Family History  Problem Relation Age of Onset   ADD / ADHD Other        brother and father   Diabetes Other     Scoliosis Other    Other Sister        browns syndrome   Seizures Sister        occipital pcs   Hashimoto's thyroiditis Sister     Social History   Tobacco Use   Smoking status: Never   Smokeless tobacco: Never  Vaping Use   Vaping Use: Never used  Substance Use Topics   Alcohol use: Not Currently   Drug use: Never      Current Outpatient Medications:    acetaminophen (TYLENOL) 325 MG tablet, Take by mouth., Disp: , Rfl:    amoxicillin-clavulanate (AUGMENTIN) 875-125 MG tablet, Take 1 tablet by mouth every 12 (twelve) hours. For sinusitis, Disp: 14 tablet, Rfl: 0   Cholecalciferol (VITAMIN D) 50 MCG (2000 UT) tablet, , Disp: , Rfl:    Cranberry-Vitamin C (CRANBERRY CONCENTRATE/VITAMINC) 15000-100 MG CAPS, , Disp: , Rfl:    fluticasone (FLONASE) 50 MCG/ACT nasal spray, Place 1 spray into both nostrils as needed.  (Patient not taking: Reported on 04/16/2020), Disp: , Rfl:    hydrOXYzine (VISTARIL) 25 MG capsule, Take 25 mg by mouth 2 (two) times daily as needed., Disp: , Rfl:    loratadine-pseudoephedrine (CLARITIN-D 12-HOUR) 5-120 MG tablet, Take 1 tablet by mouth 2 (two) times daily as needed for allergies., Disp: , Rfl:    Magnesium 400 MG CAPS, , Disp: , Rfl:    Melatonin  10 MG CAPS, Take by mouth., Disp: , Rfl:    Multiple Vitamins-Minerals ([employer] WOMENS DAILY GUMMIES) CHEW, Chew 2 tablets by mouth daily., Disp: , Rfl:    Omega-3 Fatty Acids (FISH OIL MAXIMUM STRENGTH PO), , Disp: , Rfl:    PROAIR HFA 108 (90 Base) MCG/ACT inhaler, USE 2 PUFFS BEFORE EXERCISE OR EVERY 6 HOURS AS NEEDED (Patient not taking: No sig reported), Disp: 1 Inhaler, Rfl: 0   Vitamin E 180 MG (400 UNIT) CAPS, , Disp: , Rfl:   EXAM: BP Readings from Last 3 Encounters:  02/15/18 120/68  08/17/17 99/67  06/19/17 96/64    VITALS per patient if applicable:  PSYCH/NEURO: pleasant and cooperative, no obvious depression or anxiety, speech and thought processing grossly intact Lab Results  Component Value  Date   WBC 9.1 01/10/2021   HGB 14.4 07/18/2018   HCT 14 (A) 01/10/2021   PLT 237 12/11/2020   GLUCOSE 95 08/14/2014   CHOL 176 08/14/2014   TRIG 127.0 08/14/2014   HDL 47.30 08/14/2014   LDLCALC 103 (H) 08/14/2014   ALT 22 01/10/2021   AST 16 01/10/2021   NA 142 01/10/2021   K 4.3 01/10/2021   CL 106 01/10/2021   CREATININE 0.7 12/11/2020   BUN 11 01/10/2021   CO2 26 (A) 01/10/2021   TSH 1.29 08/14/2014   Record review from [place]. ASSESSMENT AND PLAN:  Discussed the following assessment and plan:    ICD-10-CM   1. Elevated C-reactive protein (CRP)  R79.82 Ambulatory referral to Rheumatology   Persistent,recent 17.4 with a mostly normal ESR     2. Multiple joint pain  M25.50 Ambulatory referral to Rheumatology    3. Crohn's disease in remission ([place])  K50.90 Ambulatory referral to Rheumatology    4. History of COVID-19  Z86.16     5. Brain fog  R41.89 Ambulatory referral to Rheumatology    6. Alkaline phosphatase elevation  R74.8 Ambulatory referral to Rheumatology   Borderline recent     Elevated CRP over the past 3+ months not felt to be from Crohn's disease according to the GI team.  She does have joint pains and some atypical rash in her face after she eats. I reassured her about the hemoglobin hematocrit and CO2 levels. She has had a borderline elevated alk phos unclear cause she does take vitamins B12 and her vitamin D levels have been normal in the past. Advise rheumatologic evaluation for elevated CRP history of Crohn's disease joint pains fatigue and brain fog not felt to be totally from emotional problem or depression.  Remote history of COVID a year ago. Evaluate for other autoimmune diseases Referral to rheumatology Counseled.   Expectant management and discussion of plan and treatment with opportunity to ask questions and all were answered. The patient agreed with the plan and demonstrated an understanding of the instructions.   Advised to call back  or seek an in-person evaluation if worsening  or having  further concerns  in interim. Return for if worsending  in interim  if needed in person visit .    Shanon Ace, MD  Time spent in record review  counseling and plan with phone visit 35 minutes

## 2021-02-12 NOTE — Telephone Encounter (Signed)
Left patient a voicemail to notified her we are running a few mins behind for her virtual visit.

## 2021-03-10 DIAGNOSIS — F3342 Major depressive disorder, recurrent, in full remission: Secondary | ICD-10-CM | POA: Insufficient documentation

## 2021-03-10 DIAGNOSIS — Z8659 Personal history of other mental and behavioral disorders: Secondary | ICD-10-CM | POA: Insufficient documentation

## 2021-03-14 DIAGNOSIS — R7989 Other specified abnormal findings of blood chemistry: Secondary | ICD-10-CM | POA: Insufficient documentation

## 2021-03-17 ENCOUNTER — Encounter: Payer: Self-pay | Admitting: Internal Medicine

## 2021-03-17 DIAGNOSIS — R8271 Bacteriuria: Secondary | ICD-10-CM | POA: Insufficient documentation

## 2021-04-03 NOTE — Progress Notes (Signed)
Office Visit Note  Patient: Shelly Sanchez             Date of Birth: Oct 15, 2000           MRN: [id number]             PCP: Burnis Medin, MD Referring: Burnis Medin, MD Visit Date: 04/04/2021   Subjective:   History of Present Illness: Shelly Sanchez is a 21 y.o. female here for evaluation of joint pains and elevated CRP. She has a history of crohn's disease diagnosed in pediatric GI but felt to be quiescent by most recent evaluation.  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  She has had recurrence of upper respiratory infections and tonsillitis immunology work-up showing very minimal hypoagammaglobulinemia otherwise normal work-up.  Her current problems mostly developed since she had COVID infection in February last year.  At the time she experienced episodic lightheadedness and heart palpitations these were evaluated with no concerning findings and thought to possibly represent panic attack.  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  She is also noticed some painful sensation around the occiput in the back of her head but otherwise denies any specific migraines or headaches.  She has noticed increase in erythematous facial rash worsened with heat but most affected with sun exposure.  This lasts for up to a day after provocation.  He has had a increased positional tremor affecting her hands and feet does not take anything for this but discussed propranolol with primary care office in the past.  She was started on Prozac as some symptoms thought to be related to underlying anxiety disorder.  She took hydroxyzine as needed sometimes which does  improve the acute episodes of exacerbation.  She has noticed some brain fog or concentration difficulty since COVID infection last year.  She experiences cramping in her hands episodically but otherwise without obvious swelling or discoloration.  She has difficulty breathing during the lightheadedness symptoms but states this does not feel at all similar to past asthma. She noticed hair thinning at temporal areas.  Labs reviewed 01/2021 ESR 25 hsCRP 17.4  Activities of Daily Living:  Patient reports morning stiffness for 24 hours.   Patient Reports nocturnal pain.  Difficulty dressing/grooming: Reports Difficulty climbing stairs: Reports Difficulty getting out of chair: Reports Difficulty using hands for taps, buttons, cutlery, and/or writing: Reports  Review of Systems  Constitutional:  Positive for fatigue.  HENT:  Positive for mouth dryness.   Eyes:  Positive for dryness.  Respiratory:  Positive for shortness of breath.   Cardiovascular:  Positive for swelling in legs/feet.  Gastrointestinal:  Positive for constipation and diarrhea.  Endocrine: Positive for cold intolerance.  Genitourinary:  Negative for difficulty urinating.  Musculoskeletal:  Positive for joint pain, gait problem, joint pain, joint swelling, muscle weakness, morning stiffness and muscle tenderness.  Skin:  Positive for rash.  Allergic/Immunologic: Positive for susceptible to infections.  Neurological:  Positive for numbness and weakness.  Hematological:  Negative for bruising/bleeding tendency.  Psychiatric/Behavioral:  Positive for sleep disturbance.    PMFS History:  Patient Active Problem List   Diagnosis Date Noted   Panic disorder 04/04/2021   Elevated  C-reactive protein (CRP) 04/04/2021   Tremor 04/04/2021   Dizziness 04/04/2021   GBS bacteriuria 03/17/2021   Low serum progesterone 03/14/2021   Recurrent major depressive disorder, in full remission ([place]) 03/10/2021   History of panic attacks  03/10/2021   Recurrent tonsillitis 07/18/2020   Hepatosplenomegaly 06/28/2017   EBV infection 06/15/2017   Acne 12/17/2014   Crohn's disease of large intestine without complications ([place]) 47/42/5956   Blood stool 10/24/2014   ADHD (attention deficit hyperactivity disorder), 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    [employer] of 7   8th grade [employer]   Considering [employer].    Plays piano             Immunization History  Administered Date(s) Administered   DTP 08/07/1999, 10/10/1999, 12/30/1999, 01/18/2001, 05/07/2004   Hepatitis A 12/24/2005   Hepatitis A, Ped/Adol-2 Dose 12/24/2005   Hepatitis B [birth year]-1999, 07/01/1999, 12/30/1999   Hepatitis B, ped/adol 2000-03-24, 07/01/1999, 12/30/1999   HiB (PRP-OMP) 08/07/1999, 10/10/1999, 12/30/1999, 05/31/2000   Influenza Inj Mdck Quad Pf 10/30/2017   Influenza Split 11/19/2010, 12/21/2011   Influenza Whole 12/24/2005, 12/14/2006, 12/15/2007, 11/23/2008, 11/29/2009   Influenza, Seasonal, Injecte, Preservative Fre  01/07/2017   Influenza,inj,Quad PF,6+ Mos 11/03/2012, 12/06/2013, 11/06/2014, 01/07/2017, 10/22/2018, 11/18/2019   MMR 05/31/2000, 05/07/2004   Meningococcal B, OMV 08/30/2017, 11/15/2017   Meningococcal Conjugate 09/09/2010, 08/30/2017   Meningococcal Mcv4o 08/30/2017   OPV 08/07/1999, 10/10/1999, 05/31/2000   PFIZER(Purple Top)SARS-COV-2 Vaccination 04/28/2019, 05/23/2019, 12/30/2019   PPD Test 10/23/2014   Pneumococcal Conjugate-13 08/07/1999, 10/10/1999, 12/30/1999   Tdap 09/09/2010, 08/25/2017   Varicella 05/31/2000, 12/24/2005     Objective: Vital Signs: BP 124/79 (BP Location: Right Arm, Patient Position: Sitting, Cuff Size: Normal)    Pulse 87    Resp 15    Ht 5' 2"  (1.575 m)    Wt 192 lb (87.1 kg)    BMI 35.12 kg/m    Physical Exam Constitutional:      Appearance: She is obese.  HENT:     Right Ear: External ear normal.     Left Ear: External ear normal.     Mouth/Throat:     Mouth: Mucous membranes are moist.     Pharynx: Oropharynx is clear.  Eyes:     Conjunctiva/sclera: Conjunctivae normal.  Cardiovascular:     Rate and Rhythm: Normal rate and regular rhythm.  Pulmonary:     Effort: Pulmonary effort is normal.     Breath sounds: Normal breath sounds.  Musculoskeletal:     Right lower leg: No edema.     Left lower leg: No edema.  Skin:    General: Skin is warm and dry.     Findings: No rash.  Neurological:     General: No focal deficit present.     Mental Status: She is alert.     Deep Tendon Reflexes: Reflexes normal.     Comments: Fine positional tremor present in both hands  Psychiatric:        Mood and Affect: Mood normal.     Musculoskeletal Exam:  Neck full ROM no tenderness Shoulders full ROM no tenderness or swelling Elbows full ROM no tenderness or swelling Wrists full ROM no tenderness or swelling Fingers full ROM no tenderness or swelling Hip normal internal and external rotation without pain, no tenderness to lateral hip palpation Knees  full ROM no tenderness or swelling Ankles full ROM no tenderness or swelling   Investigation: No additional findings.  Imaging: No results found.  Recent Labs: Lab Results  Component Value Date   WBC 9.1 01/10/2021   HGB 14.4 07/18/2018   PLT 237 12/11/2020   NA 142 01/10/2021   K 4.3 01/10/2021   CL 106 01/10/2021   CO2 26 (A) 01/10/2021   GLUCOSE 95 08/14/2014   BUN 11 01/10/2021   CREATININE 0.7 12/11/2020   BILITOT 0.3 08/14/2014   ALKPHOS 111 01/10/2021   AST 16 01/10/2021   ALT 22 01/10/2021   PROT 8.0 08/14/2014   ALBUMIN 4.6 01/10/2021   CALCIUM 9.6 01/10/2021    Speciality Comments: No specialty comments available.  Procedures:  No procedures performed Allergies: Mucinex clear & cool day-night, Azithromycin, and Fexofenadine   Assessment / Plan:     Visit Diagnoses: Elevated C-reactive protein (CRP) - Plan: ANA, Sedimentation rate, C-reactive protein, CK  CRP elevation is somewhat chronic I do not see much objective inflammatory changes on exam.  Cardiovascular source for CRP seems unlikely no history in a young woman no cardiac abnormalities seen during her episodes of palpitations.  Checking for ANA repeating CRP sed rate also checking CK level.  I think if results are entirely negative would consider neurology assessment based on some of the symptoms.  Exercise induced bronchospasm  History of what sounds like exercise-induced asthma but more recent problems are at rest or shortly after starting activity. Numbness in fingers and lips sounds more concerning for hyperventilation than air trapping or obstruction.  Tremor  Dizziness  Positional tremor and episodic dizziness sounds more like lightheadedness than vertigo.  No obvious motor or sensory deficit on exam.  Crohn's disease of large intestine without complications ([place])  Quiescent disease by most recent GI follow-up in not having active symptoms.  Extraintestinal symptoms could explain joint pains  but tremulousness dizziness bronchospasm are not typical for IBD associated disease.  Orders: Orders Placed This Encounter  Procedures   ANA   Sedimentation rate   C-reactive protein   CK   No orders of the defined types were placed in this encounter.    Follow-Up Instructions: No follow-ups on file.   Collier Salina, MD  Note - This record has been created using [employer].  Chart creation errors have been sought, but may not always  have been located. Such creation errors  do not reflect on  the standard of medical care.

## 2021-04-04 ENCOUNTER — Other Ambulatory Visit: Payer: Self-pay

## 2021-04-04 ENCOUNTER — Ambulatory Visit: Payer: 59 | Admitting: Internal Medicine

## 2021-04-04 ENCOUNTER — Encounter: Payer: Self-pay | Admitting: Internal Medicine

## 2021-04-04 VITALS — BP 124/79 | HR 87 | Resp 15 | Ht 62.0 in | Wt 192.0 lb

## 2021-04-04 DIAGNOSIS — R251 Tremor, unspecified: Secondary | ICD-10-CM

## 2021-04-04 DIAGNOSIS — R7982 Elevated C-reactive protein (CRP): Secondary | ICD-10-CM | POA: Diagnosis not present

## 2021-04-04 DIAGNOSIS — F410 Panic disorder [episodic paroxysmal anxiety] without agoraphobia: Secondary | ICD-10-CM | POA: Insufficient documentation

## 2021-04-04 DIAGNOSIS — R42 Dizziness and giddiness: Secondary | ICD-10-CM

## 2021-04-04 DIAGNOSIS — J45990 Exercise induced bronchospasm: Secondary | ICD-10-CM | POA: Diagnosis not present

## 2021-04-04 DIAGNOSIS — K5010 Crohn's disease of large intestine without complications: Secondary | ICD-10-CM

## 2021-04-06 LAB — SEDIMENTATION RATE: Sed Rate: 25 mm/h — ABNORMAL HIGH (ref 0–20)

## 2021-04-06 LAB — C-REACTIVE PROTEIN: CRP: 24.9 mg/L — ABNORMAL HIGH (ref <8.0)

## 2021-04-06 LAB — CK: Total CK: 27 U/L — ABNORMAL LOW (ref 29–143)

## 2021-04-06 LAB — ANA: Anti Nuclear Antibody (ANA): NEGATIVE

## 2021-04-11 ENCOUNTER — Encounter: Payer: Self-pay | Admitting: Internal Medicine

## 2021-04-11 DIAGNOSIS — R251 Tremor, unspecified: Secondary | ICD-10-CM

## 2021-04-11 DIAGNOSIS — R42 Dizziness and giddiness: Secondary | ICD-10-CM

## 2021-04-14 NOTE — Telephone Encounter (Signed)
I spoke with Shelly Sanchez lab results show mild increases in inflammatory markers ANA remains negative and CK is normal. With her lack of active IBD symptoms and complaints mostly in the head and neck is unusual for this to be extraintestinal symptoms. I think she may benefit from evaluation with neurology to rule out headache related syndrome, I considered NCS for some symptoms but currently complaining worst about the headaches, fatigue, and dizziness and double vision.

## 2021-04-22 ENCOUNTER — Encounter: Payer: Self-pay | Admitting: Internal Medicine

## 2021-05-19 ENCOUNTER — Telehealth: Payer: Self-pay | Admitting: Neurology

## 2021-05-19 NOTE — Telephone Encounter (Signed)
LVM and sent mychart msg informing pt of r/s needed- MD out 4/18. ?

## 2021-05-27 ENCOUNTER — Ambulatory Visit: Payer: 59 | Admitting: Neurology

## 2021-05-29 ENCOUNTER — Ambulatory Visit: Payer: 59 | Admitting: Neurology

## 2021-05-29 ENCOUNTER — Encounter: Payer: Self-pay | Admitting: Neurology

## 2021-05-29 VITALS — Ht 62.0 in | Wt 198.0 lb

## 2021-05-29 DIAGNOSIS — G43009 Migraine without aura, not intractable, without status migrainosus: Secondary | ICD-10-CM

## 2021-05-29 NOTE — Progress Notes (Signed)
Subjective:  ?  ?Patient ID: Shelly Sanchez is a 21 y.o. female. ? ?HPI ? ? ? ?Star Age, MD, PhD ?[facility] ?[address], [address] ?[phone] ?[place], [place] [address] ? ?Dear Dr. Benjamine Mola,  ? ?I saw your patient, Shelly Sanchez, upon your kind request, in my Neurologic clinic today for her intermittent headaches.  The patient is accompanied by her mother today.  As you know, Shelly Sanchez is a 21 year old right-handed woman with an underlying medical history of Crohn's disease, allergies, joint pain with elevated CRP, currently pregnant at 20+ weeks, who reports an approximately 1 year history of recurrent headaches.  She feels that these are muscular as they start at the base of her head but sometimes they radiate forward and affect her eyes.  She has had blurry vision and also some double vision, just about a week ago saw optometry with a good exam but she is supposed to see ophthalmology as well.  She has corrective eyeglasses.  She does get sensitive to light and she has a strong family history of migraines affecting mom and both sisters.  She also has a family history of epilepsy and one of her sisters who had childhood epilepsy and outgrew it.  She has quite a few eye related problems in her family including detached retina and melanoma of the eye.  One of her great grandfather is lost an eye because of melanoma.  She has had lightheadedness which has been fairly constant.  She has talked to her OB about her symptoms and was encouraged to use Tylenol as needed, stay well-hydrated, and well rested.  She is trying all of these measures.  She has not had any sudden onset of severe/debilitating/icepick headache or thunderclap headaches.  She has had no one-sided weakness or numbness or tingling or droopy face or slurring of speech.  Sometimes she has tingling in her hands and feet.  She drinks caffeine in the form of coffee, 2 to 3 cups/day, up to 200 mg of caffeine.  She does not drink  any tea, very occasional soda, no alcohol.  She hydrates well, estimates that she drinks at least 2 L of water per day currently.  She works from home, has a sedentary job but tries to stand up every 15 minutes or so.  She has had hand trembling. ?I reviewed your office note from 04/04/2021.  She had blood work through your office at the time including CK, CRP, ESR and ANA.  Total CK was 27, ANA negative, ESR 25, CRP 24.9. ? ?Her Past Medical History Is Significant For: ?Past Medical History:  ?Diagnosis Date  ? Crohn disease ([place])   ? Exercise induced bronchospasm   ? inhaler prn.   ? Eye disease 09/13/2010  ? 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 Seasonal allergies   ? ? ?Her Past Surgical History Is Significant For: ?Past Surgical History:  ?Procedure Laterality Date  ? TOOTH EXTRACTION    ? ? ?Her Family History Is Significant For: ?Family History  ?Problem Relation Age of Onset  ? Thyroid cancer Mother   ? Migraines Mother   ? Diabetes Father   ? Thyroid disease Father   ? Hashimoto's thyroiditis Sister   ? Other Sister   ?     browns syndrome  ? Migraines Sister   ? Hashimoto's thyroiditis Sister   ? Seizures Sister   ?     occipital pcs  ?  Migraines Sister   ? ADD / ADHD Other   ?     brother and father  ? Diabetes Other   ? Scoliosis Other   ? ? ?Her Social History Is Significant For: ?Social History  ? ?Socioeconomic History  ? Marital status: Single  ?  Spouse name: Not on file  ? Number of children: Not on file  ? Years of education: Not on file  ? Highest education level: Not on file  ?Occupational History  ? Not on file  ?Tobacco Use  ? Smoking status: Never  ? Smokeless tobacco: Never  ?Vaping Use  ? Vaping Use: Never used  ?Substance and Sexual Activity  ? Alcohol use: Not Currently  ? Drug use: Never  ? Sexual activity: Yes  ?  Birth control/protection: Condom  ?Other Topics Concern  ? Not on file  ?Social History Narrative  ? Negative history of passive tobacco smoke exposure.   ? HH of 7   ? 8th grade [employer]  ? Considering [employer].   ? Plays piano   ?   ?   ?   ? ?Social Determinants of Health  ? ?Financial Resource Strain: Not on file  ?Food Insecurity: Not on file  ?Transportation Needs: Not on file  ?Physical Activity: Not on file  ?Stress: Not on file  ?Social Connections: Not on file  ? ? ?Her Allergies Are:  ?Allergies  ?Allergen Reactions  ? Mucinex Clear & Cool Day-Night Nausea And Vomiting and Shortness Of Breath  ? Azithromycin Nausea And Vomiting  ?  Possible reaction per family ?Possible reaction per family ?  ? Fexofenadine Other (See Comments)  ?  Upset stomach and heartburn  ?:  ? ?Her Current Medications Are:  ?Outpatient Encounter Medications as of 05/29/2021  ?Medication Sig  ? acetaminophen (TYLENOL) 325 MG tablet Take by mouth.  ? doxylamine, Sleep, (UNISOM) 25 MG tablet Take 25 mg by mouth at bedtime as needed.  ? FLUoxetine (PROZAC) 20 MG capsule Take 20 mg by mouth daily.  ? FLUoxetine (PROZAC) 40 MG capsule   ? Prenatal Vit-Fe Fumarate-FA (PRENATAL ONE DAILY) 27-0.8 MG TABS   ? progesterone (PROMETRIUM) 200 MG capsule Take one capsule every night at bedtime AND insert one high into the vagina every night at bedtime  ? progesterone 200 MG SUPP   ? aspirin 81 MG chewable tablet   ? Melatonin 10 MG CAPS Take by mouth.  ? ?No facility-administered encounter medications on file as of 05/29/2021.  ?: ? ? ?Review of Systems:  ?Out of a complete 14 point review of systems, all are reviewed and negative with the exception of these symptoms as listed below: ? ?Review of Systems  ?Neurological:   ?     Pt is here for headaches and dizziness. Pt states she is  [redacted] weeks pregnant. Pt states  her headaches are everyday and the pain travels from back of neck to forehead  pt states that her pain level is a 7 when having headaches Pt states she is dizzy all the time  ?  ? ?Objective:  ?Neurological Exam ? ?Physical Exam ?Physical Examination:  ? ?Vitals:  ? ?On orthostatic testing she has no  significant orthostatic blood pressure drop, appropriate increase in heart rate noted. ? ?General Examination: The patient is a very pleasant 21 y.o. female in no acute distress. She appears well-developed and well groomed.  ? ?HEENT: Normocephalic, atraumatic, pupils are equal, round and reactive to light, extraocular tracking is good  without limitation to gaze excursion or nystagmus noted.  Corrective eyeglasses in place.  Hearing is grossly intact. Face is symmetric with normal facial animation. Speech is clear with no dysarthria noted. There is no hypophonia. There is no lip, neck/head, jaw or voice tremor. Neck is supple with full range of passive and active motion. There are no carotid bruits on auscultation. Oropharynx exam reveals: mild mouth dryness. ? ?Chest: Clear to auscultation without wheezing, rhonchi or crackles noted. ? ?Heart: S1+S2+0, regular and normal without murmurs, rubs or gallops noted.  ? ?Abdomen: Soft, non-tender and non-distended, pregnant. ? ?Extremities: There is no pitting edema in the distal lower extremities bilaterally.  ? ?Skin: Warm and dry without trophic changes noted.  ? ?Musculoskeletal: exam reveals no obvious joint deformities.  ? ?Neurologically:  ?Mental status: The patient is awake, alert and oriented in all 4 spheres. Her immediate and remote memory, attention, language skills and fund of knowledge are appropriate. There is no evidence of aphasia, agnosia, apraxia or anomia. Speech is clear with normal prosody and enunciation. Thought process is linear. Mood is normal and affect is normal.  ?Cranial nerves II - XII are as described above under HEENT exam.  ?Motor exam: Normal bulk, strength and tone is noted. There is no resting tremor, she has a slight intermittent postural tremor in both upper extremities, no action tremor, no intention tremor.  Romberg negative.  Reflexes 2+ throughout including ankles, toes are downgoing bilaterally.  Fine motor skills and  coordination: Intact finger taps, hand movements, and rapid alternating patting in the upper extremities, normal foot taps bilaterally.    ?Cerebellar testing: No dysmetria or intention tremor. There is no truncal or g

## 2021-05-29 NOTE — Progress Notes (Deleted)
Subjective:  ?  ?Patient ID: Shelly Sanchez is a 21 y.o. female. ? ?HPI ?{Common ambulatory SmartLinks:[id number]} ? ?Review of Systems ? ?Objective:  ?Neurological Exam ? ?Physical Exam ? ?Assessment:  ? ?*** ? ?Plan:  ? ?*** ? ?

## 2021-05-29 NOTE — Patient Instructions (Signed)
It was nice to meet you both today.  As discussed, we will plan a follow-up after you have the baby, in case you still continue to have recurrent migraine-like headaches.  We can consider preventative medications in the future.  I do not see a pressing reason for brain MRI at this time.  Please follow-up with your OB/GYN and ophthalmologist as well as rheumatologist as planned/scheduled.   ? ?Please remember, common headache triggers are: sleep deprivation, dehydration, overheating, stress, hypoglycemia or skipping meals and blood sugar fluctuations, excessive pain medications or excessive alcohol use or caffeine withdrawal. Some people have food triggers such as aged cheese, orange juice or chocolate, especially dark chocolate, or MSG (monosodium glutamate). Try to avoid these headache triggers as much possible. It may be helpful to keep a headache diary to figure out what makes your headaches worse or brings them on and what alleviates them. Some people report headache onset after exercise but studies have shown that regular exercise may actually prevent headaches from coming. If you have exercise-induced headaches, please make sure that you drink plenty of fluid before and after exercising and that you do not over do it and do not overheat. ? ?

## 2021-06-03 ENCOUNTER — Ambulatory Visit: Payer: 59 | Admitting: Neurology

## 2021-06-24 ENCOUNTER — Ambulatory Visit: Payer: 59 | Admitting: Neurology

## 2021-12-02 ENCOUNTER — Ambulatory Visit: Payer: 59 | Admitting: Neurology

## 2021-12-04 ENCOUNTER — Ambulatory Visit: Payer: 59 | Admitting: Neurology

## 2022-02-27 ENCOUNTER — Encounter: Payer: Self-pay | Admitting: Physician Assistant

## 2022-02-27 ENCOUNTER — Ambulatory Visit: Payer: 59 | Admitting: Physician Assistant

## 2022-02-27 VITALS — BP 100/70 | HR 84 | Temp 97.5°F | Ht 62.0 in | Wt 210.5 lb

## 2022-02-27 DIAGNOSIS — J029 Acute pharyngitis, unspecified: Secondary | ICD-10-CM | POA: Diagnosis not present

## 2022-02-27 LAB — POCT RAPID STREP A (OFFICE): Rapid Strep A Screen: NEGATIVE

## 2022-02-27 LAB — POC COVID19 BINAXNOW: SARS Coronavirus 2 Ag: NEGATIVE

## 2022-02-27 MED ORDER — AMOXICILLIN 875 MG PO TABS: 875.0000 mg | ORAL_TABLET | Freq: Two times a day (BID) | ORAL | 0 refills | Status: AC

## 2022-02-27 MED ORDER — IPRATROPIUM BROMIDE 0.03 % NA SOLN: 2.0000 | Freq: Two times a day (BID) | NASAL | 1 refills | Status: DC

## 2022-02-27 NOTE — Progress Notes (Signed)
Shelly Sanchez is a 22 y.o. female here for a new problem.  History of Present Illness:   Chief Complaint  Patient presents with   Cough    Pt c/o cough, sore throat, headache, chest congestion x 3 days.    HPI  Cough Patient is complaining of clogged ears, painful cough, chest pain, nasal congestion, headache, and chills. She has hx of allergies, but not usually in the wintertime. Patients' partner mentions he was recently being sick, but had none of the patients recent symptoms. She manages her symptoms with tylenol.   She is breastfeeding.  Denies: fevers/chills/n/v/d, chest pain, SOB  Past Medical History:  Diagnosis Date   Crohn disease ([place])    Exercise induced bronchospasm    inhaler prn.    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    Seasonal allergies      Social History   Tobacco Use   Smoking status: Never   Smokeless tobacco: Never  Vaping Use   Vaping Use: Never used  Substance Use Topics   Alcohol use: Not Currently   Drug use: Never    Past Surgical History:  Procedure Laterality Date   TOOTH EXTRACTION      Family History  Problem Relation Age of Onset   Thyroid cancer Mother    Migraines Mother    Diabetes Father    Thyroid disease Father    Hashimoto's thyroiditis Sister    Other Sister        browns syndrome   Migraines Sister    Hashimoto's thyroiditis Sister    Seizures Sister        occipital pcs   Migraines Sister    ADD / ADHD Other        brother and father   Diabetes Other    Scoliosis Other     Allergies  Allergen Reactions   Mucinex Clear & Cool Day-Night Nausea And Vomiting and Shortness Of Breath   Azithromycin Nausea And Vomiting    Possible reaction per family Possible reaction per family    Fexofenadine Other (See Comments)    Upset stomach and heartburn    Current Medications:   Current Outpatient Medications:    acetaminophen (TYLENOL) 325 MG tablet, Take by mouth., Disp: ,  Rfl:    amoxicillin (AMOXIL) 875 MG tablet, Take 1 tablet (875 mg total) by mouth 2 (two) times daily for 10 days., Disp: 20 tablet, Rfl: 0   ipratropium (ATROVENT) 0.03 % nasal spray, Place 2 sprays into both nostrils every 12 (twelve) hours., Disp: 30 mL, Rfl: 1   Prenatal Vit-Fe Fumarate-FA (PRENATAL ONE DAILY) 27-0.8 MG TABS, , Disp: , Rfl:    Review of Systems:   Review of Systems  Constitutional:  Positive for chills.  HENT:  Positive for congestion (nasal).   Respiratory:  Positive for cough.   Cardiovascular:  Positive for chest pain.  Neurological:  Positive for headaches.    Vitals:   Vitals:   02/27/22 1320  BP: 100/70  Pulse: 84  Temp: (!) 97.5 F (36.4 C)  TempSrc: Temporal  SpO2: 98%  Weight: 210 lb 8 oz (95.5 kg)  Height: '5\' 2"'$  (1.575 m)     Body mass index is 38.5 kg/m.  Physical Exam:   Physical Exam Constitutional:      General: She is not in acute distress.    Appearance: Normal appearance. She is not ill-appearing.  HENT:  Head: Normocephalic and atraumatic.     Right Ear: External ear normal. A middle ear effusion is present. Tympanic membrane is not perforated, erythematous or retracted.     Left Ear: External ear normal. A middle ear effusion is present. Tympanic membrane is erythematous. Tympanic membrane is not perforated or retracted.  Eyes:     Extraocular Movements: Extraocular movements intact.     Pupils: Pupils are equal, round, and reactive to light.  Cardiovascular:     Rate and Rhythm: Normal rate and regular rhythm.     Heart sounds: Normal heart sounds. No murmur heard.    No gallop.  Pulmonary:     Effort: Pulmonary effort is normal. No respiratory distress.     Breath sounds: Normal breath sounds. No wheezing or rales.  Skin:    General: Skin is warm and dry.  Neurological:     Mental Status: She is alert and oriented to person, place, and time.  Psychiatric:        Judgment: Judgment normal.    Results for orders  placed or performed in visit on 02/27/22  POCT rapid strep A  Result Value Ref Range   Rapid Strep A Screen Negative Negative  POC COVID-19  Result Value Ref Range   SARS Coronavirus 2 Ag Negative Negative    Assessment and Plan:   Sore throat No red flags on exam. Strep and COVID negative. Will initiate amoxicillin for AOM per orders. Discussed taking medications as prescribed. Reviewed return precautions including worsening fever, SOB, worsening cough or other concerns. Push fluids and rest. I recommend that patient follow-up if symptoms worsen or persist despite treatment x 7-10 days, sooner if needed.  I,Verona Buck,acting as a Education administrator for [employer], PA.,have documented all relevant documentation on the behalf of Inda Coke, PA,as directed by  Inda Coke, PA while in the presence of Inda Coke, [place].  I, Inda Coke, [place], have reviewed all documentation for this visit. The documentation on 02/27/22 for the exam, diagnosis, procedures, and orders are all accurate and complete.  Inda Coke, PA-C

## 2022-03-01 ENCOUNTER — Encounter: Payer: Self-pay | Admitting: Physician Assistant

## 2022-03-03 ENCOUNTER — Encounter: Payer: Self-pay | Admitting: Physician Assistant

## 2022-03-03 ENCOUNTER — Ambulatory Visit: Payer: 59 | Admitting: Physician Assistant

## 2022-03-03 VITALS — BP 110/68 | HR 82 | Temp 97.5°F | Ht 62.0 in | Wt 211.5 lb

## 2022-03-03 DIAGNOSIS — R051 Acute cough: Secondary | ICD-10-CM

## 2022-03-03 MED ORDER — ALBUTEROL SULFATE HFA 108 (90 BASE) MCG/ACT IN AERS: 2.0000 | INHALATION_SPRAY | Freq: Four times a day (QID) | RESPIRATORY_TRACT | 0 refills | Status: DC | PRN

## 2022-03-03 MED ORDER — QVAR REDIHALER 40 MCG/ACT IN AERB: 1.0000 | INHALATION_SPRAY | Freq: Two times a day (BID) | RESPIRATORY_TRACT | 1 refills | Status: DC

## 2022-03-03 NOTE — Progress Notes (Signed)
Shelly Sanchez is a 22 y.o. female here for a follow up of a pre-existing problem.  History of Present Illness:   Chief Complaint  Patient presents with   Cough    Pt c/o cough and wheezing, started 4 days ago. Has been using cough drops and Tylenol.   Cough Patient was seen by me on 02/27/22. She was found to have AOM and started on amoxicillin. She tolerated this and ear pain/fullness has resolved. She now has wheezing and ongoing cough. Denies: chest pain, SOB, n/v/d, fever, chills, poor appetite  She is breastfeeding  Has hx of exercise-induced bronchospasm -- does not have current inhaler  Past Medical History:  Diagnosis Date   Crohn disease ([place])    Exercise induced bronchospasm    inhaler prn.    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    Seasonal allergies      Social History   Tobacco Use   Smoking status: Never   Smokeless tobacco: Never  Vaping Use   Vaping Use: Never used  Substance Use Topics   Alcohol use: Not Currently   Drug use: Never    Past Surgical History:  Procedure Laterality Date   TOOTH EXTRACTION      Family History  Problem Relation Age of Onset   Thyroid cancer Mother    Migraines Mother    Diabetes Father    Thyroid disease Father    Hashimoto's thyroiditis Sister    Other Sister        browns syndrome   Migraines Sister    Hashimoto's thyroiditis Sister    Seizures Sister        occipital pcs   Migraines Sister    ADD / ADHD Other        brother and father   Diabetes Other    Scoliosis Other     Allergies  Allergen Reactions   Mucinex Clear & Cool Day-Night Nausea And Vomiting and Shortness Of Breath   Azithromycin Nausea And Vomiting    Possible reaction per family Possible reaction per family    Fexofenadine Other (See Comments)    Upset stomach and heartburn    Current Medications:   Current Outpatient Medications:    acetaminophen (TYLENOL) 325 MG tablet, Take by mouth.,  Disp: , Rfl:    amoxicillin (AMOXIL) 875 MG tablet, Take 1 tablet (875 mg total) by mouth 2 (two) times daily for 10 days., Disp: 20 tablet, Rfl: 0   ipratropium (ATROVENT) 0.03 % nasal spray, Place 2 sprays into both nostrils every 12 (twelve) hours., Disp: 30 mL, Rfl: 1   Prenatal Vit-Fe Fumarate-FA (PRENATAL ONE DAILY) 27-0.8 MG TABS, , Disp: , Rfl:    Review of Systems:   Review of Systems  Respiratory:  Positive for cough.    Negative unless otherwise specified per HPI.  Vitals:   Vitals:   03/03/22 1016  BP: 110/68  Pulse: 82  Temp: (!) 97.5 F (36.4 C)  TempSrc: Temporal  SpO2: 98%  Weight: 211 lb 8 oz (95.9 kg)  Height: '5\' 2"'$  (1.575 m)     Body mass index is 38.68 kg/m.  Physical Exam:   Physical Exam Vitals and nursing note reviewed.  Constitutional:      General: She is not in acute distress.    Appearance: She is well-developed. She is not ill-appearing or toxic-appearing.  HENT:     Head: Normocephalic and atraumatic.  Right Ear: Tympanic membrane, ear canal and external ear normal. Tympanic membrane is not erythematous, retracted or bulging.     Left Ear: Tympanic membrane, ear canal and external ear normal. Tympanic membrane is not erythematous, retracted or bulging.     Nose: Nose normal.     Right Sinus: No maxillary sinus tenderness or frontal sinus tenderness.     Left Sinus: No maxillary sinus tenderness or frontal sinus tenderness.     Mouth/Throat:     Pharynx: Uvula midline. No posterior oropharyngeal erythema.  Eyes:     General: Lids are normal.     Conjunctiva/sclera: Conjunctivae normal.  Neck:     Trachea: Trachea normal.  Cardiovascular:     Rate and Rhythm: Normal rate and regular rhythm.     Heart sounds: Normal heart sounds, S1 normal and S2 normal.  Pulmonary:     Effort: Pulmonary effort is normal.     Breath sounds: Wheezing present. No decreased breath sounds, rhonchi or rales.  Lymphadenopathy:     Cervical: No cervical  adenopathy.  Skin:    General: Skin is warm and dry.  Neurological:     Mental Status: She is alert.  Psychiatric:        Speech: Speech normal.        Behavior: Behavior normal. Behavior is cooperative.     Assessment and Plan:   Acute cough No red flags Possible cough-variant asthma -- will trial ICS QVAR BID  Would like to avoid prednisone at this time due to breastfeeding status, however if sx do not improve, may have to add this Albuterol prn Consider CXR if new/worsening/lack of improvement   Inda Coke, PA-C

## 2022-03-03 NOTE — Patient Instructions (Signed)
It was great to see you!  I have sent in QVAR inhaler -- if this is too expensive, please let us know -- this is a steroid inhaler. Use twice daily I have also sent in as-needed albuterol inhaler for any shortness of breath.  If no improvement in a few days, we may need to get a chest xray, keep me posted!

## 2022-03-05 ENCOUNTER — Encounter: Payer: Self-pay | Admitting: Physician Assistant

## 2022-03-05 ENCOUNTER — Other Ambulatory Visit: Payer: Self-pay | Admitting: Physician Assistant

## 2022-03-05 DIAGNOSIS — R051 Acute cough: Secondary | ICD-10-CM

## 2022-03-06 ENCOUNTER — Ambulatory Visit (INDEPENDENT_AMBULATORY_CARE_PROVIDER_SITE_OTHER)
Admission: RE | Admit: 2022-03-06 | Discharge: 2022-03-06 | Disposition: A | Discharge status: Home / Self Care | Payer: 59 | Source: Ambulatory Visit | Attending: Physician Assistant | Admitting: Physician Assistant

## 2022-03-06 DIAGNOSIS — R051 Acute cough: Secondary | ICD-10-CM | POA: Diagnosis not present

## 2022-03-12 ENCOUNTER — Encounter: Payer: Self-pay | Admitting: Physician Assistant

## 2022-03-12 ENCOUNTER — Ambulatory Visit: Payer: 59 | Admitting: Physician Assistant

## 2022-03-13 ENCOUNTER — Other Ambulatory Visit: Payer: Self-pay | Admitting: Family

## 2022-03-13 DIAGNOSIS — L509 Urticaria, unspecified: Secondary | ICD-10-CM

## 2022-03-13 NOTE — Telephone Encounter (Signed)
Please see message this is Dr. Velora Mediate patient.

## 2022-03-20 ENCOUNTER — Ambulatory Visit: Payer: 59 | Admitting: Internal Medicine

## 2022-08-24 ENCOUNTER — Ambulatory Visit: Payer: 59 | Admitting: Physician Assistant

## 2022-08-25 ENCOUNTER — Ambulatory Visit: Payer: 59 | Admitting: Internal Medicine

## 2022-08-25 ENCOUNTER — Encounter: Payer: Self-pay | Admitting: Internal Medicine

## 2022-08-25 VITALS — BP 118/78 | HR 77 | Temp 98.3°F | Ht 62.0 in | Wt 191.4 lb

## 2022-08-25 DIAGNOSIS — K5090 Crohn's disease, unspecified, without complications: Secondary | ICD-10-CM

## 2022-08-25 DIAGNOSIS — R7982 Elevated C-reactive protein (CRP): Secondary | ICD-10-CM

## 2022-08-25 DIAGNOSIS — Z808 Family history of malignant neoplasm of other organs or systems: Secondary | ICD-10-CM

## 2022-08-25 DIAGNOSIS — Z79899 Other long term (current) drug therapy: Secondary | ICD-10-CM | POA: Diagnosis not present

## 2022-08-25 DIAGNOSIS — R42 Dizziness and giddiness: Secondary | ICD-10-CM | POA: Diagnosis not present

## 2022-08-25 NOTE — Progress Notes (Signed)
Chief Complaint  Patient presents with   Vision Change    Pt c/o having double vision/ seeing spot. Going on since 2022.    Excessive Sweating   Insomnia   dry skin/hair    Pt c/o hair loss/ dandruff.    Palpitations   Weight Loss    Pt c/o of losing weight without trying.     HPI: KIMARI NOUR 22 y.o. come in for on going sx multiple and progressing and concern   Covid 2022  beginning Then en dec 22 pregnancy  felt presyncopal   ever since has had  faintness . And kicked up recently  hot all the time  seats in past year.   Dry skin and hair loss  . Shedding.   But has lost weight unexpectedly  30 in past months without effort . 2 periods June  August . 22  monthly  pain ful  suspect endometriosis . Vision since  covid black spots in vision  every day has  vision bald spots .   Insomnia and heart racing . Hyper feeling  10.5 months     Hard to sleep  dizzy and heart racing and sweating. Tapers  meds in past   Ob gyne   lab 1 week post partum  On porzac poss not different.  Months  was on 60 now 40 mg  .  Mind path  virtual .  Is [facility] prescriber .  Feels  exerted .  At times  exercise intolerance but not specific events . Stays hydrated .  Crohns in remission. ROS: See pertinent positives and negatives per HPI.  Past Medical History:  Diagnosis Date   Crohn disease (HCC)    Exercise induced bronchospasm    inhaler prn.    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    Seasonal allergies     Family History  Problem Relation Age of Onset   Thyroid cancer Mother    Migraines Mother    Diabetes Father    Thyroid disease Father    Hashimoto's thyroiditis Sister    Other Sister        browns syndrome   Migraines Sister    Hashimoto's thyroiditis Sister    Seizures Sister        occipital pcs   Migraines Sister    ADD / ADHD Other        brother and father   Diabetes Other    Scoliosis Other     Social History   Socioeconomic  History   Marital status: Married    Spouse name: Not on file   Number of children: Not on file   Years of education: Not on file   Highest education level: Bachelor's degree (e.g., BA, AB, BS)  Occupational History   Not on file  Tobacco Use   Smoking status: Never   Smokeless tobacco: Never  Vaping Use   Vaping status: Never Used  Substance and Sexual Activity   Alcohol use: Not Currently   Drug use: Never   Sexual activity: Yes    Birth control/protection: Condom  Other Topics Concern   Not on file  Social History Narrative   Negative history of passive tobacco smoke exposure.    HH of 7   8th grade [employer]   Considering [employer].    Plays piano             Social Determinants of Health  Financial Resource Strain: Low Risk  (08/24/2022)   Overall Financial Resource Strain (CARDIA)    Difficulty of Paying Living Expenses: Not hard at all  Food Insecurity: No Food Insecurity (08/24/2022)   Hunger Vital Sign    Worried About Running Out of Food in the Last Year: Never true    Ran Out of Food in the Last Year: Never true  Transportation Needs: No Transportation Needs (08/24/2022)   PRAPARE - Administrator, Civil Service (Medical): No    Lack of Transportation (Non-Medical): No  Physical Activity: Sufficiently Active (08/24/2022)   Exercise Vital Sign    Days of Exercise per Week: 7 days    Minutes of Exercise per Session: 90 min  Stress: Stress Concern Present (08/24/2022)   [employer] of Occupational Health - Occupational Stress Questionnaire    Feeling of Stress : Very much  Social Connections: Moderately Integrated (08/24/2022)   Social Connection and Isolation Panel [NHANES]    Frequency of Communication with Friends and Family: Three times a week    Frequency of Social Gatherings with Friends and Family: More than three times a week    Attends Religious Services: More than 4 times per year    Active Member of [employer] or Organizations: No    Attends  Engineer, structural: Not on file    Marital Status: Married    Outpatient Medications Prior to Visit  Medication Sig Dispense Refill   acetaminophen (TYLENOL) 325 MG tablet Take by mouth.     albuterol (VENTOLIN HFA) 108 (90 Base) MCG/ACT inhaler Inhale 2 puffs into the lungs every 6 (six) hours as needed for wheezing or shortness of breath. 8 g 0   beclomethasone (QVAR REDIHALER) 40 MCG/ACT inhaler Inhale 1 puff into the lungs 2 (two) times daily. 1 each 1   Prenatal Vit-Fe Fumarate-FA (PRENATAL ONE DAILY) 27-0.8 MG TABS      ipratropium (ATROVENT) 0.03 % nasal spray Place 2 sprays into both nostrils every 12 (twelve) hours. 30 mL 1   No facility-administered medications prior to visit.     EXAM:  BP 118/78 (BP Location: Right Arm, Patient Position: Sitting, Cuff Size: Large)   Pulse 77   Temp 98.3 F (36.8 C) (Oral)   Ht 5\' 2"  (1.575 m)   Wt 191 lb 6.4 oz (86.8 kg)   LMP 07/30/2022 (Approximate) Comment: started on 07/15/2022-07/21/2022, random spotting through out this month.  SpO2 99%   Breastfeeding Yes   BMI 35.01 kg/m   Body mass index is 35.01 kg/m. Using Natural  family FP  GENERAL: vitals reviewed and listed above, alert, oriented, appears well hydrated and in no acute distress HEENT: atraumatic, conjunctiva  clear, no obvious abnormalities on inspection of external nose and ears OP : no lesion edema or exudate  NECK: no obvious masses on inspection palpation  LUNGS: clear to auscultation bilaterally, no wheezes, rales or rhonchi, good air movement CV: HRRR, no clubbing cyanosis or  peripheral edema nl cap refill  MS: moves all extremities without noticeable focal  abnormality PSYCH: pleasant and cooperative, no obvious depression or anxiety Lab Results  Component Value Date   WBC 11.6 (H) 08/25/2022   HGB 14.3 08/25/2022   HCT 43.9 08/25/2022   PLT 264.0 08/25/2022   GLUCOSE 110 (H) 08/25/2022   CHOL 198 08/25/2022   TRIG 250.0 (H) 08/25/2022   HDL  41.70 08/25/2022   LDLDIRECT 135.0 08/25/2022   LDLCALC 103 (H) 08/14/2014   ALT 16 08/25/2022  AST 14 08/25/2022   NA 139 08/25/2022   K 4.2 08/25/2022   CL 101 08/25/2022   CREATININE 0.72 08/25/2022   BUN 11 08/25/2022   CO2 27 08/25/2022   TSH 1.18 08/25/2022   HGBA1C 5.5 08/25/2022   BP Readings from Last 3 Encounters:  08/25/22 118/78  03/03/22 110/68  02/27/22 100/70    ASSESSMENT AND PLAN:  Discussed the following assessment and plan:  Orthostatic lightheadedness - sounds pre syncopal  post covid ( but also had ft pregnancy) - Plan: Basic metabolic panel, CBC with Differential/Platelet, Hemoglobin A1c, Hepatic function panel, Lipid panel, TSH, T4, free, T3, free, Thyroid stimulating immunoglobulin, Thyroid Peroxidase Antibody, C-reactive protein  Family history of thyroid cancer mom - Plan: Basic metabolic panel, CBC with Differential/Platelet, Hemoglobin A1c, Hepatic function panel, Lipid panel, TSH, T4, free, T3, free, Thyroid stimulating immunoglobulin, Thyroid Peroxidase Antibody, C-reactive protein  Crohn's disease in remission (HCC) - Plan: Basic metabolic panel, CBC with Differential/Platelet, Hemoglobin A1c, Hepatic function panel, Lipid panel, TSH, T4, free, T3, free, Thyroid stimulating immunoglobulin, Thyroid Peroxidase Antibody, C-reactive protein  Elevated C-reactive protein (CRP)  history - Plan: Basic metabolic panel, CBC with Differential/Platelet, Hemoglobin A1c, Hepatic function panel, Lipid panel, TSH, T4, free, T3, free, Thyroid stimulating immunoglobulin, Thyroid Peroxidase Antibody, C-reactive protein  Medication management - Plan: Basic metabolic panel, CBC with Differential/Platelet, Hemoglobin A1c, Hepatic function panel, Lipid panel, TSH, T4, free, T3, free, Thyroid stimulating immunoglobulin, Thyroid Peroxidase Antibody, C-reactive protein Multiple concerns  but  pre syncopal  and now recent unintended weight loss  but obese range . Concern about  hormonal problem or other cause .  Fam hx of thyroid and autoimmune . ?  Acne and hair    thinning ?  No stria  noted  or hyper cortisol appearance  Metabolic  assessment  Has hx of IBD not flaring and elevated crp  repeat today  Cannot make one dx  but  noted that all began after covid 19 infection .  Medications could give some side effects also . Exam unrevealing  Plan fu depending on results   if light headedness continues consider cards eval orthostatic  lightheadedness after covid ( and pregnancy and childbirth intervening)  -Patient advised to return or notify health care team  if  new concerns arise.  Patient Instructions  Uncertain causes   Make list timeline of medications.  Consider other hormonal evaluation  but getting thryoid today .   Orthostatic  hypotension has been described  after covid and covid vaccine.   Neta Mends. Kenniya Westrich M.D.

## 2022-08-25 NOTE — Patient Instructions (Addendum)
Uncertain causes   Make list timeline of medications.  Consider other hormonal evaluation  but getting thryoid today .   Orthostatic  hypotension has been described  after covid and covid vaccine.

## 2022-08-26 LAB — CBC WITH DIFFERENTIAL/PLATELET
Basophils Absolute: 0.1 10*3/uL (ref 0.0–0.1)
Basophils Relative: 0.9 % (ref 0.0–3.0)
Eosinophils Absolute: 0.1 10*3/uL (ref 0.0–0.7)
Eosinophils Relative: 1.2 % (ref 0.0–5.0)
HCT: 43.9 % (ref 36.0–46.0)
Hemoglobin: 14.3 g/dL (ref 12.0–15.0)
Lymphocytes Relative: 25.6 % (ref 12.0–46.0)
Lymphs Abs: 3 10*3/uL (ref 0.7–4.0)
MCHC: 32.6 g/dL (ref 30.0–36.0)
MCV: 84.4 fl (ref 78.0–100.0)
Monocytes Absolute: 0.7 10*3/uL (ref 0.1–1.0)
Monocytes Relative: 6.2 % (ref 3.0–12.0)
Neutro Abs: 7.7 10*3/uL (ref 1.4–7.7)
Neutrophils Relative %: 66.1 % (ref 43.0–77.0)
Platelets: 264 10*3/uL (ref 150.0–400.0)
RBC: 5.2 Mil/uL — ABNORMAL HIGH (ref 3.87–5.11)
RDW: 13.8 % (ref 11.5–15.5)
WBC: 11.6 10*3/uL — ABNORMAL HIGH (ref 4.0–10.5)

## 2022-08-26 LAB — BASIC METABOLIC PANEL
BUN: 11 mg/dL (ref 6–23)
CO2: 27 mEq/L (ref 19–32)
Calcium: 10.3 mg/dL (ref 8.4–10.5)
Chloride: 101 mEq/L (ref 96–112)
Creatinine, Ser: 0.72 mg/dL (ref 0.40–1.20)
GFR: 118 mL/min (ref >60.00)
Glucose, Bld: 110 mg/dL — ABNORMAL HIGH (ref 70–99)
Potassium: 4.2 mEq/L (ref 3.5–5.1)
Sodium: 139 mEq/L (ref 135–145)

## 2022-08-26 LAB — HEPATIC FUNCTION PANEL
ALT: 16 U/L (ref 0–35)
AST: 14 U/L (ref 0–37)
Albumin: 4.8 g/dL (ref 3.5–5.2)
Alkaline Phosphatase: 121 U/L — ABNORMAL HIGH (ref 39–117)
Bilirubin, Direct: 0 mg/dL (ref 0.0–0.3)
Total Bilirubin: 0.3 mg/dL (ref 0.2–1.2)
Total Protein: 7.7 g/dL (ref 6.0–8.3)

## 2022-08-26 LAB — LDL CHOLESTEROL, DIRECT: Direct LDL: 135 mg/dL

## 2022-08-26 LAB — LIPID PANEL
Cholesterol: 198 mg/dL (ref 0–200)
HDL: 41.7 mg/dL (ref >39.00)
NonHDL: 156.46
Total CHOL/HDL Ratio: 5
Triglycerides: 250 mg/dL — ABNORMAL HIGH (ref 0.0–149.0)
VLDL: 50 mg/dL — ABNORMAL HIGH (ref 0.0–40.0)

## 2022-08-26 LAB — T4, FREE: Free T4: 0.62 ng/dL (ref 0.60–1.60)

## 2022-08-26 LAB — T3, FREE: T3, Free: 3.8 pg/mL (ref 2.3–4.2)

## 2022-08-26 LAB — TSH: TSH: 1.18 u[IU]/mL (ref 0.35–5.50)

## 2022-08-26 LAB — C-REACTIVE PROTEIN: CRP: 1.5 mg/dL (ref 0.5–20.0)

## 2022-08-26 LAB — HEMOGLOBIN A1C: Hgb A1c MFr Bld: 5.5 % (ref 4.6–6.5)

## 2022-08-31 LAB — THYROID STIMULATING IMMUNOGLOBULIN: TSI: <89 % baseline (ref <140)

## 2022-08-31 LAB — THYROID PEROXIDASE ANTIBODY: Thyroperoxidase Ab SerPl-aCnc: <1 IU/mL (ref <9)

## 2022-09-01 ENCOUNTER — Encounter: Payer: Self-pay | Admitting: Internal Medicine

## 2022-09-01 DIAGNOSIS — R42 Dizziness and giddiness: Secondary | ICD-10-CM

## 2022-09-01 NOTE — Progress Notes (Signed)
So all labs are in now and no thyroid abnormalities  . Triglycerides  up some as well as ldl 135  and blood sugar borderline but no diabetes  . All this acts like  a metabolic risk state. ( Risk of getting diabetes)   WBC is slightly increased but no anemia   non specific finding .  So all of this ...no  specific cause of your symptoms .    I would suggest we get a cardiology consult to assess  orthostatic light headedness  since it is persistent . And interfering  Let us know if you agree and  then staff can place referral

## 2022-10-09 ENCOUNTER — Ambulatory Visit (INDEPENDENT_AMBULATORY_CARE_PROVIDER_SITE_OTHER): Payer: 59

## 2022-10-09 ENCOUNTER — Ambulatory Visit: Payer: 59 | Admitting: Cardiology

## 2022-10-09 VITALS — BP 116/72 | HR 87 | Ht 62.0 in | Wt 187.8 lb

## 2022-10-09 DIAGNOSIS — R42 Dizziness and giddiness: Secondary | ICD-10-CM

## 2022-10-09 DIAGNOSIS — R55 Syncope and collapse: Secondary | ICD-10-CM

## 2022-10-09 NOTE — Progress Notes (Unsigned)
Enrolled patient for a 14 day Zio XT  monitor to be mailed to patients home  °

## 2022-10-09 NOTE — Patient Instructions (Addendum)
Medication Instructions:  Your physician recommends that you continue on your current medications as directed. Please refer to the Current Medication list given to you today.   *If you need a refill on your cardiac medications before your next appointment, please call your pharmacy*.   Testing/Procedures: Echo will be scheduled at [address].  Your physician has requested that you have an echocardiogram. Echocardiography is a painless test that uses sound waves to create images of your heart. It provides your doctor with information about the size and shape of your heart and how well your heart's chambers and valves are working. This procedure takes approximately one hour. There are no restrictions for this procedure. Please do NOT wear cologne, perfume, aftershave, or lotions (deodorant is allowed). Please arrive 15 minutes prior to your appointment time.     ZIO XT- Long Term Monitor Instructions  Your physician has requested you wear a ZIO patch monitor for 14 days.  This is a single patch monitor. [employer] supplies one patch monitor per enrollment. Additional stickers are not available. Please do not apply patch if you will be having a Nuclear Stress Test,  Echocardiogram, Cardiac CT, MRI, or Chest Xray during the period you would be wearing the  monitor. The patch cannot be worn during these tests. You cannot remove and re-apply the  ZIO XT patch monitor.  Your ZIO patch monitor will be mailed 3 day USPS to your address on file. It may take 3-5 days  to receive your monitor after you have been enrolled.  Once you have received your monitor, please review the enclosed instructions. Your monitor  has already been registered assigning a specific monitor serial # to you.  Billing and Patient Assistance Program Information  We have supplied [employer] with any of your insurance information on file for billing purposes. [employer] offers a sliding scale Patient Assistance  Program for patients that do not have  insurance, or whose insurance does not completely cover the cost of the ZIO monitor.  You must apply for the Patient Assistance Program to qualify for this discounted rate.  To apply, please call [employer] at [phone], select option 4, select option 2, ask to apply for  Patient Assistance Program. Meredeth Ide will ask your household income, and how many people  are in your household. They will quote your out-of-pocket cost based on that information.  [employer] will also be able to set up a 74-month, interest-free payment plan if needed.  Applying the monitor   Shave hair from upper left chest.  Hold abrader disc by orange tab. Rub abrader in 40 strokes over the upper left chest as  indicated in your monitor instructions.  Clean area with 4 enclosed alcohol pads. Let dry.  Apply patch as indicated in monitor instructions. Patch will be placed under collarbone on left  side of chest with arrow pointing upward.  Rub patch adhesive wings for 2 minutes. Remove white label marked "1". Remove the white  label marked "2". Rub patch adhesive wings for 2 additional minutes.  While looking in a mirror, press and release button in center of patch. A small green light will  flash 3-4 times. This will be your only indicator that the monitor has been turned on.  Do not shower for the first 24 hours. You may shower after the first 24 hours.  Press the button if you feel a symptom. You will hear a small click. Record Date, Time and  Symptom in the Patient  Logbook.  When you are ready to remove the patch, follow instructions on the last 2 pages of Patient  Logbook. Stick patch monitor onto the last page of Patient Logbook.  Place Patient Logbook in the blue and white box. Use locking tab on box and tape box closed  securely. The blue and white box has prepaid postage on it. Please place it in the mailbox as  soon as possible. Your physician should have your test results  approximately 7 days after the  monitor has been mailed back to [facility].  Call [facility] Customer Care at [phone] if you have questions regarding  your ZIO XT patch monitor. Call them immediately if you see an orange light blinking on your  monitor.  If your monitor falls off in less than 4 days, contact our Monitor department at [phone].  If your monitor becomes loose or falls off after 4 days call [employer] at [phone] for  suggestions on securing your monitor    Follow-Up: At [facility] you and your health needs are our priority.  As part of our continuing mission to provide you with exceptional heart care, we have created designated Provider Care Teams.  These Care Teams include your primary Cardiologist (physician) and Advanced Practice Providers (APPs -  Physician Assistants and Nurse Practitioners) who all work together to provide you with the care you need, when you need it.     Your next appointment:   4 month(s)  The format for your next appointment:   In Person  Provider:   Thomasene Ripple, DO    Other Instructions Dr. Servando Salina recommends getting an abdominal binder.

## 2022-10-09 NOTE — Progress Notes (Signed)
[facility]Obstetrics Clinic  New Evaluation  Date:  10/09/2022   ID:  Shelly Sanchez, DOB Dec 24, 2000, MRN [id number]  PCP:  Madelin Headings, MD   [facility] Providers Cardiologist:  None  Electrophysiologist:  None       Referring MD: Madelin Headings, MD   Chief Complaint: " I am having dizziness"  History of Present Illness:    Shelly Sanchez is a 22 y.o. female [G1P0] who is being seen today for the evaluation of dizziness and weakness.  At the request of Panosh, Neta Mends, MD.   She is here today with her mother.  She presented to be evaluated for intermittent dizziness and weakness.  She also notes that she has had some syncope episodes.  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  She tells me that recently she has been experiencing heart fluttering as well as some skipped beats.  She notes that this occurs at different times.  Sometimes insignificant where she feels it that her heart is stopping.  She admits that she has had some syncope episodes in the past.  She is with her mother who tells me that she/the mother has had thyroid radiation due to a malignant thyroid tumor as well as rheumatic disease.   Prior CV Studies Reviewed: The following studies were reviewed today: None to review  Past Medical History:  Diagnosis Date   Crohn disease (HCC)    Exercise induced bronchospasm    inhaler prn.    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   albuterol  (VENTOLIN HFA) 108 (90 Base) MCG/ACT inhaler Inhale 2 puffs into the lungs every 6 (six) hours as needed for wheezing or shortness of breath.   beclomethasone (QVAR REDIHALER) 40 MCG/ACT inhaler Inhale 1 puff into the lungs 2 (two) times daily.   Prenatal Vit-Fe Fumarate-FA (PRENATAL ONE DAILY) 27-0.8 MG TABS    PROZAC 10 MG capsule Take 10 mg by mouth daily.   PROZAC 20 MG capsule Take 20 mg by mouth daily.   PROZAC 40 MG capsule Take 40 mg by mouth daily.     Allergies:   Mucinex clear & cool day-night, Azithromycin, and Fexofenadine   Social History   Socioeconomic History   Marital status: Married    Spouse name: Not on file   Number of children: Not on file   Years of education: Not on file   Highest education level: Bachelor's degree (e.g., BA, AB, BS)  Occupational History   Not on file  Tobacco Use   Smoking status: Never   Smokeless tobacco: Never  Vaping  Use   Vaping status: Never Used  Substance and Sexual Activity   Alcohol use: Not Currently   Drug use: Never   Sexual activity: Yes    Birth control/protection: Condom  Other Topics Concern   Not on file  Social History Narrative   Negative history of passive tobacco smoke exposure.    HH of 7   8th grade [employer]   Considering [employer].    Plays piano             Social Determinants of Health   Financial Resource Strain: Low Risk  (08/24/2022)   Overall Financial Resource Strain (CARDIA)    Difficulty of Paying Living Expenses: Not hard at all  Food Insecurity: No Food Insecurity (08/24/2022)   Hunger Vital Sign    Worried About Running Out of Food in the Last Year: Never true    Ran Out of Food in the Last Year: Never true  Transportation Needs: No Transportation Needs (08/24/2022)   PRAPARE - Administrator, Civil Service (Medical): No    Lack of Transportation (Non-Medical): No  Physical Activity: Sufficiently Active (08/24/2022)   Exercise Vital Sign    Days of Exercise per Week: 7 days     Minutes of Exercise per Session: 90 min  Stress: Stress Concern Present (08/24/2022)   [employer] of Occupational Health - Occupational Stress Questionnaire    Feeling of Stress : Very much  Social Connections: Moderately Integrated (08/24/2022)   Social Connection and Isolation Panel [NHANES]    Frequency of Communication with Friends and Family: Three times a week    Frequency of Social Gatherings with Friends and Family: More than three times a week    Attends Religious Services: More than 4 times per year    Active Member of [employer] or Organizations: No    Attends Engineer, structural: Not on file    Marital Status: Married      Family History  Problem Relation Age of Onset   Thyroid cancer Mother    Migraines Mother    Diabetes Father    Thyroid disease Father    Hashimoto's thyroiditis Sister    Other Sister        browns syndrome   Migraines Sister    Hashimoto's thyroiditis Sister    Seizures Sister        occipital pcs   Migraines Sister    ADD / ADHD Other        brother and father   Diabetes Other    Scoliosis Other       ROS:   Please see the history of present illness.    Dizziness All other systems reviewed and are negative.   Labs/EKG Reviewed:    EKG:   EKG was ordered today.  The ekg ordered today demonstrates sinus rhythm, heart rate 87 bpm  Recent Labs: 08/25/2022: ALT 16; BUN 11; Creatinine, Ser 0.72; Hemoglobin 14.3; Platelets 264.0; Potassium 4.2; Sodium 139; TSH 1.18   Recent Lipid Panel Lab Results  Component Value Date/Time   CHOL 198 08/25/2022 03:32 PM   TRIG 250.0 (H) 08/25/2022 03:32 PM   HDL 41.70 08/25/2022 03:32 PM   CHOLHDL 5 08/25/2022 03:32 PM   LDLCALC 103 (H) 08/14/2014 04:27 PM   LDLDIRECT 135.0 08/25/2022 03:32 PM    Physical Exam:    VS:  BP 116/72 (BP Location: Left Arm, Patient Position: Sitting, Cuff Size: Normal)   Pulse 87   Ht 5\' 2"  (1.575 m)  Wt 187 lb 12.8 oz (85.2 kg)   LMP 10/03/2022  (Approximate)   SpO2 98%   Breastfeeding Yes   BMI 34.35 kg/m     Wt Readings from Last 3 Encounters:  10/09/22 187 lb 12.8 oz (85.2 kg)  08/25/22 191 lb 6.4 oz (86.8 kg)  03/03/22 211 lb 8 oz (95.9 kg)     GEN:  Well nourished, well developed in no acute distress HEENT: Normal NECK: No JVD; No carotid bruits LYMPHATICS: No lymphadenopathy CARDIAC: RRR, no murmurs, rubs, gallops RESPIRATORY:  Clear to auscultation without rales, wheezing or rhonchi  ABDOMEN: Soft, non-tender, non-distended MUSCULOSKELETAL:  No edema; No deformity  SKIN: Warm and dry NEUROLOGIC:  Alert and oriented x 3 PSYCHIATRIC:  Normal affect    Risk Assessment/Risk Calculators:                 ASSESSMENT & PLAN:    Dizziness History of syncope  I would like to rule out a cardiovascular etiology of this dizziness/syncope therefore at this time I would like to placed a zio patch for 14  days. In additon, with previous syncope episode a transthoracic echocardiogram will be ordered to assess LV/RV function and any structural abnormalities. Once these testing have been performed amd reviewed further reccomendations will be made. For now, I do reccomend that the patient goes to the nearest ED if  symptoms recur.   There are no Patient Instructions on file for this visit.   Dispo:  No follow-ups on file.   Medication Adjustments/Labs and Tests Ordered: Current medicines are reviewed at length with the patient today.  Concerns regarding medicines are outlined above.  Tests Ordered: Orders Placed This Encounter  Procedures   EKG 12-Lead   Medication Changes: No orders of the defined types were placed in this encounter.

## 2022-10-14 DIAGNOSIS — R55 Syncope and collapse: Secondary | ICD-10-CM | POA: Diagnosis not present

## 2022-10-14 DIAGNOSIS — R42 Dizziness and giddiness: Secondary | ICD-10-CM | POA: Diagnosis not present

## 2022-10-30 ENCOUNTER — Ambulatory Visit (HOSPITAL_COMMUNITY): Payer: 59 | Attending: Cardiovascular Disease

## 2022-10-30 DIAGNOSIS — R42 Dizziness and giddiness: Secondary | ICD-10-CM

## 2022-10-30 DIAGNOSIS — R55 Syncope and collapse: Secondary | ICD-10-CM | POA: Insufficient documentation

## 2022-10-30 LAB — ECHOCARDIOGRAM COMPLETE
Area-P 1/2: 5.26 cm2
S' Lateral: 3.2 cm

## 2022-11-09 ENCOUNTER — Ambulatory Visit: Payer: 59 | Admitting: Cardiology

## 2022-11-19 ENCOUNTER — Ambulatory Visit: Payer: 59 | Admitting: Family Medicine

## 2022-11-19 ENCOUNTER — Encounter: Payer: Self-pay | Admitting: Family Medicine

## 2022-11-19 VITALS — BP 110/68 | HR 98 | Temp 98.4°F | Wt 187.0 lb

## 2022-11-19 DIAGNOSIS — J019 Acute sinusitis, unspecified: Secondary | ICD-10-CM | POA: Diagnosis not present

## 2022-11-19 DIAGNOSIS — J4521 Mild intermittent asthma with (acute) exacerbation: Secondary | ICD-10-CM

## 2022-11-19 MED ORDER — IPRATROPIUM-ALBUTEROL 0.5-2.5 (3) MG/3ML IN SOLN
3.0000 mL | Freq: Once | RESPIRATORY_TRACT | Status: AC
Start: 2022-11-19 — End: 2022-11-19
  Administered 2022-11-19: 3 mL via RESPIRATORY_TRACT

## 2022-11-19 MED ORDER — PREDNISONE 20 MG PO TABS: 20.0000 mg | ORAL_TABLET | Freq: Two times a day (BID) | ORAL | 0 refills | Status: DC

## 2022-11-19 MED ORDER — ALBUTEROL SULFATE (2.5 MG/3ML) 0.083% IN NEBU: 2.5000 mg | INHALATION_SOLUTION | RESPIRATORY_TRACT | 0 refills | Status: DC | PRN

## 2022-11-19 MED ORDER — CEFUROXIME AXETIL 500 MG PO TABS: 500.0000 mg | ORAL_TABLET | Freq: Two times a day (BID) | ORAL | 0 refills | Status: AC

## 2022-11-19 NOTE — Progress Notes (Signed)
   Subjective:    Patient ID: Shelly Sanchez, female    DOB: 06-Apr-2000, 22 y.o.   MRN: [id number]  HPI Here to follow up a visit to a [facility] urgent care on 11-11-22 for fever, coughing and wheezing. At that visit she tested negative for Covid. She was diagnosed with a right otitis media which was triggering her asthma. She was given a nebulizer treatment there, and she was sent home on 7 days of Augmentin and some Prednisone. Today she says this never helped at all. She still has chest tightness, wheezing, SOB, and coughing up yellow sputum. No ear pain, but she has sinus congestion and a headache. She has been using her Albuterol inhaler several times a day. She has a nebulizer at home but no medication to put in it.    Review of Systems  Constitutional:  Positive for fever.  HENT:  Positive for congestion, postnasal drip and sinus pain. Negative for ear pain and sore throat.   Eyes: Negative.   Respiratory:  Positive for cough, chest tightness, shortness of breath and wheezing.   Gastrointestinal: Negative.        Objective:   Physical Exam Constitutional:      Appearance: Normal appearance. She is not ill-appearing.  HENT:     Right Ear: Tympanic membrane, ear canal and external ear normal.     Left Ear: Tympanic membrane, ear canal and external ear normal.     Nose: Nose normal.     Mouth/Throat:     Pharynx: Oropharynx is clear.  Eyes:     Conjunctiva/sclera: Conjunctivae normal.  Pulmonary:     Breath sounds: No rhonchi or rales.     Comments: Her chest is tight with diffuse wheezing  Lymphadenopathy:     Cervical: No cervical adenopathy.  Neurological:     Mental Status: She is alert.   She is given a nebulizer treatment with Duoneb. Afterwards she was breathing more easily, and she was moving more air on exam.         Assessment & Plan:  She has a sinusitis that is triggering an acute exacerbation of her asthma. The otitis media has apparently resolved. We will  treat her with 10 days of Cefuroxime and 5 days of Prednisone 20 mg BID. She can use her inhaler as needed, and we provided albuterol vials to use in her nebulizer. Follow up as needed. We spent a total of (31   ) minutes reviewing records and discussing these issues.  Gershon Crane, MD

## 2022-11-19 NOTE — Addendum Note (Signed)
Addended by: Carola Rhine on: 11/19/2022 12:51 PM   Modules accepted: Orders

## 2022-11-26 ENCOUNTER — Emergency Department (HOSPITAL_BASED_OUTPATIENT_CLINIC_OR_DEPARTMENT_OTHER): Payer: 59 | Admitting: Radiology

## 2022-11-26 ENCOUNTER — Emergency Department (HOSPITAL_BASED_OUTPATIENT_CLINIC_OR_DEPARTMENT_OTHER)
Admission: EM | Admit: 2022-11-26 | Discharge: 2022-11-26 | Disposition: A | Discharge status: Home / Self Care | Payer: 59 | Attending: Emergency Medicine | Admitting: Emergency Medicine

## 2022-11-26 ENCOUNTER — Other Ambulatory Visit: Payer: Self-pay

## 2022-11-26 ENCOUNTER — Telehealth: Payer: Self-pay | Admitting: Internal Medicine

## 2022-11-26 ENCOUNTER — Encounter (HOSPITAL_BASED_OUTPATIENT_CLINIC_OR_DEPARTMENT_OTHER): Payer: Self-pay

## 2022-11-26 ENCOUNTER — Emergency Department (HOSPITAL_BASED_OUTPATIENT_CLINIC_OR_DEPARTMENT_OTHER): Payer: 59

## 2022-11-26 DIAGNOSIS — G44209 Tension-type headache, unspecified, not intractable: Secondary | ICD-10-CM | POA: Insufficient documentation

## 2022-11-26 DIAGNOSIS — R052 Subacute cough: Secondary | ICD-10-CM

## 2022-11-26 DIAGNOSIS — Z20822 Contact with and (suspected) exposure to covid-19: Secondary | ICD-10-CM | POA: Diagnosis not present

## 2022-11-26 DIAGNOSIS — J0100 Acute maxillary sinusitis, unspecified: Secondary | ICD-10-CM | POA: Diagnosis not present

## 2022-11-26 DIAGNOSIS — J45909 Unspecified asthma, uncomplicated: Secondary | ICD-10-CM | POA: Diagnosis not present

## 2022-11-26 DIAGNOSIS — R059 Cough, unspecified: Secondary | ICD-10-CM | POA: Diagnosis present

## 2022-11-26 LAB — RESP PANEL BY RT-PCR (RSV, FLU A&B, COVID)  RVPGX2
Influenza A by PCR: NEGATIVE
Influenza B by PCR: NEGATIVE
Resp Syncytial Virus by PCR: NEGATIVE
SARS Coronavirus 2 by RT PCR: NEGATIVE

## 2022-11-26 MED ORDER — AMOXICILLIN-POT CLAVULANATE 875-125 MG PO TABS
1.0000 | ORAL_TABLET | Freq: Two times a day (BID) | ORAL | 0 refills | Status: AC
Start: 2022-11-26 — End: 2022-12-03

## 2022-11-26 NOTE — ED Provider Notes (Signed)
[place] EMERGENCY DEPARTMENT AT [facility] Provider Note   CSN: [id number] Arrival date & time: 11/26/22  1549     History  Chief Complaint  Patient presents with   Migraine    Shelly Sanchez is a 22 y.o. female with history of asthma, presents with concern for a migraine ongoing for the past 4 days.  States for the past month she has been having a dry cough, sinus pressure, ear pain.  She reports being initially treated with Augmentin.  Symptoms do not start to improve, so was started on cefuroxime by her PCP 7 days ago.  Her cough has not improved, and she also started to develop a migraine a couple days ago.  The migraine is in the occipital region and spreads to the forehead bilaterally.  Reports photophobia and phonophobia.  Denies nausea or vomiting, vision changes, fever, sudden onset, no neck pain.  She does not have a history of headaches.  Tylenol has not helped with her pain.  Patient is breast-feeding.   Migraine       Home Medications Prior to Admission medications   Medication Sig Start Date End Date Taking? Authorizing Provider  amoxicillin-clavulanate (AUGMENTIN) 875-125 MG tablet Take 1 tablet by mouth every 12 (twelve) hours for 7 days. 11/26/22 12/03/22 Yes Arabella Merles, PA-C  acetaminophen (TYLENOL) 325 MG tablet Take by mouth.    [provider]  albuterol (PROVENTIL) (2.5 MG/3ML) 0.083% nebulizer solution Take 3 mLs (2.5 mg total) by nebulization every 4 (four) hours as needed for wheezing or shortness of breath. 11/19/22   Nelwyn Salisbury, MD  albuterol (VENTOLIN HFA) 108 (90 Base) MCG/ACT inhaler Inhale 2 puffs into the lungs every 6 (six) hours as needed for wheezing or shortness of breath. 03/03/22   Jarold Motto, PA  beclomethasone (QVAR REDIHALER) 40 MCG/ACT inhaler Inhale 1 puff into the lungs 2 (two) times daily. 03/03/22   Jarold Motto, PA  cefUROXime (CEFTIN) 500 MG tablet Take 1 tablet (500 mg total) by mouth 2  (two) times daily with a meal for 10 days. 11/19/22 11/29/22  Nelwyn Salisbury, MD  predniSONE (DELTASONE) 20 MG tablet Take 1 tablet (20 mg total) by mouth 2 (two) times daily with a meal. 11/19/22   Nelwyn Salisbury, MD  Prenatal Vit-Fe Fumarate-FA (PRENATAL ONE DAILY) 27-0.8 MG TABS     [provider]  PROZAC 10 MG capsule Take 10 mg by mouth daily.    [provider]  PROZAC 20 MG capsule Take 20 mg by mouth daily.    [provider]  PROZAC 40 MG capsule Take 40 mg by mouth daily. 12/22/21   [provider]      Allergies    Mucinex clear & cool day-night, Azithromycin, and Fexofenadine    Review of Systems   Review of Systems  HENT:         Headache  Respiratory:  Positive for cough.     Physical Exam Updated Vital Signs BP 121/69 (BP Location: Right Arm)   Pulse 95   Temp 98 F (36.7 C) (Oral)   Resp 18   Ht 5\' 2"  (1.575 m)   Wt 83.9 kg   SpO2 97%   Breastfeeding Yes   BMI 33.84 kg/m  Physical Exam Vitals and nursing note reviewed.  Constitutional:      General: She is not in acute distress.    Appearance: She is well-developed.     Comments: Well appearing, no vomiting  HENT:     Head: Normocephalic and atraumatic.  Eyes:     Extraocular Movements: Extraocular movements intact.     Conjunctiva/sclera: Conjunctivae normal.     Pupils: Pupils are equal, round, and reactive to light.  Neck:     Comments: No nuchal rigidity Cardiovascular:     Rate and Rhythm: Normal rate and regular rhythm.     Heart sounds: No murmur heard. Pulmonary:     Effort: Pulmonary effort is normal. No respiratory distress.     Breath sounds: Normal breath sounds.     Comments: Dry cough  Abdominal:     Palpations: Abdomen is soft.     Tenderness: There is no abdominal tenderness.  Musculoskeletal:        General: No swelling.     Cervical back: Neck supple.  Skin:    General: Skin is warm and dry.     Capillary Refill: Capillary refill takes  less than 2 seconds.  Neurological:     General: No focal deficit present.     Mental Status: She is alert.     Comments: Cranial nerves 3-12 intact  Psychiatric:        Mood and Affect: Mood normal.     ED Results / Procedures / Treatments   Labs (all labs ordered are listed, but only abnormal results are displayed) Labs Reviewed  RESP PANEL BY RT-PCR (RSV, FLU A&B, COVID)  [id number]    EKG None  Radiology DG Chest 2 View  Result Date: 11/26/2022 CLINICAL DATA:  Cough and cold symptoms for 1 month. Negative for COVID and flu. EXAM: CHEST - 2 VIEW COMPARISON:  PA Lat 03/06/2022 FINDINGS: The heart size and mediastinal contours are within normal limits. Both lungs are clear. The visualized skeletal structures are unremarkable. Since the prior study, the external body fat has decreased in thickness consistent with interval weight loss. Clinical correlation advised as to whether this was voluntary or involuntary weight loss. IMPRESSION: 1. No evidence of acute chest disease. 2. Since the prior study, the external body fat has decreased in thickness consistent with interval weight loss. Clinical correlation suggested. Electronically Signed   By: Almira Bar M.D.   On: 11/26/2022 20:16   CT Head Wo Contrast  Result Date: 11/26/2022 CLINICAL DATA:  Persistent migraine, sinusitis, otitis media EXAM: CT HEAD WITHOUT CONTRAST TECHNIQUE: Contiguous axial images were obtained from the base of the skull through the vertex without intravenous contrast. RADIATION DOSE REDUCTION: This exam was performed according to the departmental dose-optimization program which includes automated exposure control, adjustment of the mA and/or kV according to patient size and/or use of iterative reconstruction technique. COMPARISON:  None Available. FINDINGS: Brain: No evidence of acute infarction, hemorrhage, mass, mass effect, or midline shift. No hydrocephalus or extra-axial fluid collection. Vascular: No  hyperdense vessel. Skull: Negative for fracture or focal lesion. Sinuses/Orbits: Mucosal thickening in the paranasal sinuses, with bubbly fluid in the left maxillary sinus and left sphenoid sinus. No acute finding in the orbits. Other: The mastoid air cells are well aerated. No fluid in the middle ear bilaterally. IMPRESSION: 1. No acute intracranial process. 2. Mucosal thickening in the paranasal sinuses, with bubbly fluid in the left maxillary sinus and left sphenoid sinus, which can be seen in the setting of acute sinusitis. Electronically Signed   By: Wiliam Ke M.D.   On: 11/26/2022 20:08    Procedures Procedures    Medications Ordered in ED Medications - No data to display  ED  Course/ Medical Decision Making/ A&P                                 Medical Decision Making Amount and/or Complexity of Data Reviewed Radiology: ordered.  Risk Prescription drug management.   22 y.o. female with past medical history of asthma, currently breast-feeding presents to the ED for concern of cough and sinus pressure for 1 month, headache that began in the last 4 days  Differential diagnosis includes but is not limited to COVID, flu, RSV, URI, bronchitis, pneumonia, sinusitis, tension headache, migraine, intracranial hemorrhage  ED Course:  Patient overall well-appearing no acute distress.  Vital signs stable.  She reports a dry cough that has been ongoing for the past month.  Reports she has been treated with Augmentin and cefuroxime for an ear infection and sinusitis, but has not gotten significantly better.  She presents today primarily with concern for her headache that started about 4 days ago.  The pain is in both sides of the head.  Associated with photophobia and phonophobia.  No nausea or vomiting.  No vision changes, neck stiffness, fever or chills. No concern for meningitis. She denies any history of migraines.  Reports she has been taking Tylenol at home without significant relief. CT  head obtained due to the new migraine and chronicity of her sinusitis symptoms.  CT head showed sinusitis, but no other acute abnormalities.  I offered patient medication here for headache, but she declines at this time.  We reviewed that the chest x-ray shows no signs of a pneumonia or pleural effusion.  COVID, strep, RSV negative.  I suspect her cough may be secondary to bronchitis.  Patient stable for discharge home at this time. We discussed that she should not finish the cefdinir prescribed by her PCP.  I have prescribed Augmentin to be started if she does not improve on the cefdinir.  She is currently breast-feeding, unable to prescribe anything further.    Impression: Sinusitis Cough Tension headache  Disposition:  The patient was discharged home with instructions to take the cefdinir prescribed by PCP.  Start Augmentin if by the end of the cefdinir she does not notice improvement in her symptoms.  Flonase daily to help with nasal congestion.  Follow-up with PCP within the next week if symptoms do not start to improve.  Tylenol and ibuprofen as needed for headache at home. Return precautions given.  Lab Tests: I Ordered, and personally interpreted labs.  The pertinent results include:   Flu, COVID, RSV negative  Imaging Studies ordered: I ordered imaging studies including chest x-ray I independently visualized the imaging with scope of interpretation limited to determining acute life threatening conditions related to emergency care. Imaging showed no signs of pneumonia, no pleural effusions I agree with the radiologist interpretation   External records from outside source obtained and reviewed including PCP note from 11/19/2022 for sinusitis, she was prescribed cefuroxime and prednisone             Final Clinical Impression(s) / ED Diagnoses Final diagnoses:  Acute maxillary sinusitis, recurrence not specified  Subacute cough  Tension headache    Rx / DC Orders ED  Discharge Orders          Ordered    amoxicillin-clavulanate (AUGMENTIN) 875-125 MG tablet  Every 12 hours        11/26/22 2039  Arabella Merles, PA-C 11/27/22 0001    Royanne Foots, DO 11/30/22 [id number]

## 2022-11-26 NOTE — Telephone Encounter (Signed)
Spouse called to F/U on this inquiry, stating Pt is experiencing debilitating symptoms and he really needs some advice. Spouse was transferred to the Triage Nurse, in the interim, for advice.

## 2022-11-26 NOTE — ED Triage Notes (Signed)
Pt POV from reporting persistent migraine that began a few days ago. Pt seen 10/10 at [facility], dx sinusitis+otitis media symptoms persist, now having severe migraine

## 2022-11-26 NOTE — Telephone Encounter (Signed)
Pt's spouse says cefUROXime (CEFTIN) 500 MG tablet has given her horrible migraines since it was prescribed to her. Seeking guidance as to what she can do to remedy this

## 2022-11-26 NOTE — Discharge Instructions (Addendum)
Your chest x-ray showed no signs of a pneumonia or fluid collection in the lungs.  I suspect your cough is from bronchitis.  This needs to run its course.  Your flu, COVID, RSV is negative today.  Your CT scan showed no acute abnormalities.  It did show a sinus infection.  I will send in a course of Augmentin for the sinus infection.  You may take this antibiotic if you have not started to feel better with the cefdinir prescribed by your PCP.  I also recommend using Flonase (fluticasone) to help with nasal congestion.  This is an over-the-counter medication you can obtain at any drugstore.  You may use 2 puffs in each nostril daily.  You may take up to 1000mg  of tylenol every 6 hours as needed for pain.  Do not take more then 4g per day.  You may use up to 600mg  ibuprofen every 6 hours as needed for pain.  Do not exceed 2.4g of ibuprofen per day.  Please follow-up with your PCP if symptoms have not started to improve within the next week.  Return to the ER for any fevers, neck stiffness, any other new or concerning symptoms.

## 2022-11-27 NOTE — Telephone Encounter (Signed)
Noted  

## 2022-11-27 NOTE — Telephone Encounter (Signed)
She was seen in the ED for this yesterday

## 2022-11-30 NOTE — Plan of Care (Signed)
[facility] Tonsillectomy/Adenoidectomy, Postoperative PEDS care plan entered in error.

## 2022-12-07 ENCOUNTER — Ambulatory Visit (INDEPENDENT_AMBULATORY_CARE_PROVIDER_SITE_OTHER): Payer: 59 | Admitting: Family Medicine

## 2022-12-07 VITALS — BP 100/72 | HR 79 | Temp 98.0°F | Wt 189.0 lb

## 2022-12-07 DIAGNOSIS — J4520 Mild intermittent asthma, uncomplicated: Secondary | ICD-10-CM

## 2022-12-07 DIAGNOSIS — J0100 Acute maxillary sinusitis, unspecified: Secondary | ICD-10-CM

## 2022-12-07 DIAGNOSIS — R197 Diarrhea, unspecified: Secondary | ICD-10-CM | POA: Diagnosis not present

## 2022-12-07 NOTE — Progress Notes (Signed)
   Subjective:    Patient ID: Shelly Sanchez, female    DOB: October 18, 2000, 22 y.o.   MRN: [id number]  HPI Here to follow up an ED visit on 11-26-22. She saw an urgent care clinic on 11-11-22 for chest tightness and coughing, and they gave her Augmentin and Prednisone. Then she saw [place] on 11-19-22 for the same symptoms, and we gave her Cefuroxime and Prednisone. Over the next week her cough improved but she began to have headaches in the back of her head. No fever. She went to the ED on 10-17 24 where her exam was normal, and a CXR was normal. She tested negative for flu, Covid, and RSV. A head CT showed sinusitis in the maxillary and sphenoid sinuses. They gave her Augmentin again, which she finished a few days ago. Today she has not cough or SOB or headaches, but she has had diarrhea for the past 3 days. No abdominal pains or fever. She has had some nausea but has not vomited. Her stools are either semi-formed or liquid. She is using some Imodium.    Review of Systems  Constitutional: Negative.   HENT: Negative.    Respiratory: Negative.    Cardiovascular: Negative.   Gastrointestinal:  Positive for diarrhea and nausea. Negative for abdominal distention, abdominal pain, blood in stool, constipation and vomiting.       Objective:   Physical Exam Constitutional:      Appearance: Normal appearance.  HENT:     Right Ear: Tympanic membrane, ear canal and external ear normal.     Left Ear: Tympanic membrane, ear canal and external ear normal.     Nose: Nose normal.     Mouth/Throat:     Pharynx: Oropharynx is clear.  Eyes:     Conjunctiva/sclera: Conjunctivae normal.  Pulmonary:     Effort: Pulmonary effort is normal.     Breath sounds: Normal breath sounds.  Abdominal:     General: Abdomen is flat. Bowel sounds are normal. There is no distension.     Palpations: Abdomen is soft. There is no mass.     Tenderness: There is no abdominal tenderness. There is no right CVA tenderness, left CVA  tenderness, guarding or rebound.     Hernia: No hernia is present.  Lymphadenopathy:     Cervical: No cervical adenopathy.  Neurological:     Mental Status: She is alert.           Assessment & Plan:  Her sinusitis appears to be resolved, and her asthmatic bronchitis has resolved. Now she has some diarrhea which is likely to be a side effect of the antibiotics she has been taking. I think the chance that this is a C difficile infection is quite low. I suggested she start taking a probiotic OTC until her stools get back to normal. We spent a total of ( 34  ) minutes reviewing records and discussing these issues.  Gershon Crane, MD

## 2022-12-24 ENCOUNTER — Other Ambulatory Visit: Payer: Self-pay | Admitting: Physician Assistant

## 2022-12-29 ENCOUNTER — Other Ambulatory Visit: Payer: Self-pay | Admitting: Family Medicine

## 2022-12-29 ENCOUNTER — Other Ambulatory Visit: Payer: Self-pay | Admitting: Physician Assistant

## 2022-12-29 ENCOUNTER — Telehealth: Payer: Self-pay | Admitting: Internal Medicine

## 2022-12-29 NOTE — Telephone Encounter (Signed)
Prescription Request  12/29/2022  LOV: 08/25/2022  What is the name of the medication or equipment? albuterol (VENTOLIN HFA) 108 (90 Base) MCG/ACT inhaler   Have you contacted your pharmacy to request a refill? No   Which pharmacy would you like this sent to?  [facility] [id number] [place],  - [address] [address] Phone: [phone] Fax: [phone]    Patient notified that their request is being sent to the clinical staff for review and that they should receive a response within 2 business days.   Please advise at Mobile [phone] (mobile)

## 2022-12-31 ENCOUNTER — Other Ambulatory Visit: Payer: Self-pay | Admitting: Family

## 2022-12-31 MED ORDER — ALBUTEROL SULFATE HFA 108 (90 BASE) MCG/ACT IN AERS: 2.0000 | INHALATION_SPRAY | Freq: Four times a day (QID) | RESPIRATORY_TRACT | 0 refills | Status: DC | PRN

## 2023-01-14 ENCOUNTER — Telehealth: Payer: Self-pay | Admitting: Cardiology

## 2023-01-14 NOTE — Telephone Encounter (Signed)
 Attempted to call patient, no answer left message requesting a call back.

## 2023-01-14 NOTE — Telephone Encounter (Signed)
Patient is requesting call back to discuss appt coming up. She states she is slightly sick and would like to know if this appt can be a virtual. Please advise.

## 2023-01-14 NOTE — Telephone Encounter (Signed)
Patient identification verified by 2 forms. Marilynn Rail, RN    Called and spoke to patient  Patient requests virtual appointment for same day and time  Informed patient message sent for assistance

## 2023-01-15 NOTE — Telephone Encounter (Signed)
Mychart message sent per patient request  Advised patient to outreach with any issues

## 2023-01-15 NOTE — Telephone Encounter (Signed)
Pt returned call. She state she is not pregnant. She asked if you can't reach her to please send answer via mychart.

## 2023-01-15 NOTE — Telephone Encounter (Signed)
 2nd attempt to call patient, no answer left message requesting a call back.

## 2023-01-15 NOTE — Telephone Encounter (Signed)
Patient identification verified by 2 forms. Shelly Rail, RN    Called and spoke to patient  Patient states she is not pregnant at this time  Patient requests mychart message for update regarding appointment

## 2023-01-18 ENCOUNTER — Encounter: Payer: Self-pay | Admitting: Cardiology

## 2023-01-18 ENCOUNTER — Ambulatory Visit: Payer: 59 | Attending: Cardiology | Admitting: Cardiology

## 2023-01-18 VITALS — Ht 62.0 in | Wt 186.0 lb

## 2023-01-18 DIAGNOSIS — R55 Syncope and collapse: Secondary | ICD-10-CM

## 2023-01-18 DIAGNOSIS — R42 Dizziness and giddiness: Secondary | ICD-10-CM | POA: Diagnosis not present

## 2023-01-18 NOTE — Progress Notes (Signed)
Virtual Visit via Video Note   Because of Shelly Sanchez's co-morbid illnesses, she is at least at moderate risk for complications without adequate follow up.  This format is felt to be most appropriate for this patient at this time.  All issues noted in this document were discussed and addressed.  A limited physical exam was performed with this format.  Please refer to the patient's chart for her consent to telehealth for [facility].  Video Connection Lost Video connection was lost at < 50% of the duration of this visit, at which time the remainder of the visit was completed via audio only.    Date:  01/18/2023   ID:  Shelly Sanchez, DOB 12/11/2000, MRN [id number]  Patient Location: Home Provider Location: Office/Clinic  PCP:  Madelin Headings, MD  Cardiologist:  Thomasene Ripple, DO  Electrophysiologist:  None   Evaluation Performed:  Follow-Up Visit  Chief Complaint:  " I am ok"  Virtual Visit via Video  Note . I connected with the patient today by a   video enabled telemedicine application and verified that I am speaking with the correct person using two identifiers.   History of Present Illness:    Shelly Sanchez is a 22 y.o. female with hx of crohn disease, with persistent dizziness. At her last visit.   She reports dizziness occurring three to five times daily. The dizziness is not associated with syncope or pregnancy. The patient has been monitored for cardiac arrhythmias, but none were detected. The patient has not been evaluated by an ENT specialist or neurologist for this issue. The patient is concerned about the cause of the dizziness and is seeking further evaluation and treatment.  The patient does not have symptoms concerning for COVID-19 infection (fever, chills, cough, or new shortness of breath).    Past Medical History:  Diagnosis Date   Crohn disease (HCC)    Exercise induced bronchospasm    inhaler prn.    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   albuterol (PROVENTIL) (2.5 MG/3ML) 0.083% nebulizer solution TAKE (2.5 MG TOTAL) BY NEBULIZATION EVERY 4 HOURS AS NEEDED FOR WHEEZING OR SHORTNESS OF BREATH   albuterol (VENTOLIN HFA) 108 (90 Base) MCG/ACT inhaler Inhale 2 puffs into the lungs every 6 (six) hours as needed for wheezing or shortness of breath.   beclomethasone (QVAR REDIHALER) 40 MCG/ACT inhaler Inhale 1 puff into the lungs 2 (two) times daily.   Prenatal Vit-Fe Fumarate-FA (PRENATAL ONE DAILY) 27-0.8 MG TABS    PROZAC 20 MG capsule Take 20 mg by mouth daily.   PROZAC 40 MG capsule Take 40 mg by mouth daily.     Allergies:   Mucinex clear & cool day-night, Azithromycin, and Fexofenadine   Social History   Tobacco Use   Smoking status: Never   Smokeless tobacco: Never  Vaping Use   Vaping status: Never Used  Substance Use Topics   Alcohol use: Not Currently   Drug use: Never     Family Hx: The patient's family history includes ADD / ADHD in an other family member; Diabetes in her father and another family member; Hashimoto's thyroiditis in her sister and sister; Migraines in  her mother, sister, and sister; Other in her sister; Scoliosis in an other family member; Seizures in her sister; Thyroid cancer in her mother; Thyroid disease in her father.  ROS:   Please see the history of present illness.    Dizziness  All other systems reviewed and are negative.   Prior CV studies:   The following studies were reviewed today:    Labs/Other Tests and Data Reviewed:    EKG:  No ECG reviewed.  Recent Labs: 08/25/2022: ALT 16; BUN 11; Creatinine, Ser 0.72; Hemoglobin 14.3; Platelets 264.0; Potassium 4.2; Sodium 139; TSH 1.18   Recent Lipid Panel Lab Results  Component Value Date/Time    CHOL 198 08/25/2022 03:32 PM   TRIG 250.0 (H) 08/25/2022 03:32 PM   HDL 41.70 08/25/2022 03:32 PM   CHOLHDL 5 08/25/2022 03:32 PM   LDLCALC 103 (H) 08/14/2014 04:27 PM   LDLDIRECT 135.0 08/25/2022 03:32 PM    Wt Readings from Last 3 Encounters:  01/18/23 186 lb (84.4 kg)  12/07/22 189 lb (85.7 kg)  11/26/22 185 lb (83.9 kg)     Objective:    Vital Signs:  Ht 5\' 2"  (1.575 m)   Wt 186 lb (84.4 kg)   BMI 34.02 kg/m      ASSESSMENT & PLAN:    Dizziness Occurring 3-5 times daily. Cardiac arrhythmias have been ruled out. Possible Benign Paroxysmal Positional Vertigo (BPPV) is suspected. -Refer to ENT  Echo and monitor both normal   COVID-19 Education: The signs and symptoms of COVID-19 were discussed with the patient and how to seek care for testing (follow up with PCP or arrange E-visit).  The importance of social distancing was discussed today.  Time:   Today, I have spent 15 minutes with the patient with telehealth technology discussing the above problems.     Medication Adjustments/Labs and Tests Ordered: Current medicines are reviewed at length with the patient today.  Concerns regarding medicines are outlined above.   Tests Ordered: Orders Placed This Encounter  Procedures   Ambulatory referral to ENT    Medication Changes: No orders of the defined types were placed in this encounter.   Follow Up:  In Person as needed  Osvaldo Shipper, DO  01/18/2023 5:13 PM    [facility]

## 2023-01-18 NOTE — Patient Instructions (Addendum)
Medication Instructions:  Your physician recommends that you continue on your current medications as directed. Please refer to the Current Medication list given to you today.  *If you need a refill on your cardiac medications before your next appointment, please call your pharmacy*    Follow-Up: At [facility], you and your health needs are our priority.  As part of our continuing mission to provide you with exceptional heart care, we have created designated Provider Care Teams.  These Care Teams include your primary Cardiologist (physician) and Advanced Practice Providers (APPs -  Physician Assistants and Nurse Practitioners) who all work together to provide you with the care you need, when you need it.   Your next appointment:   As needed   Provider:   Thomasene Ripple, DO     Other Instructions An referral had been made for ENT: Patrici Ranks [address] [place] [address]   [phone]

## 2023-02-05 ENCOUNTER — Encounter (INDEPENDENT_AMBULATORY_CARE_PROVIDER_SITE_OTHER): Payer: Self-pay | Admitting: Otolaryngology

## 2023-02-05 ENCOUNTER — Other Ambulatory Visit: Payer: Self-pay | Admitting: Family Medicine

## 2023-02-08 MED ORDER — ALBUTEROL SULFATE (2.5 MG/3ML) 0.083% IN NEBU: INHALATION_SOLUTION | RESPIRATORY_TRACT | 0 refills | Status: DC

## 2023-02-08 NOTE — Progress Notes (Deleted)
 Virtual Visit via Video Note  I connected with Shelly Sanchez on 02/08/23 at  8:45 AM EST by a video enabled telemedicine application and verified that I am speaking with the correct person using two identifiers. Location patient: home Location provider:work  office Persons participating in the virtual visit: patient, provider  Patient aware  of the limitations of evaluation and management by telemedicine and  availability of in person appointments. and agreed to proceed.   HPI: Shelly Sanchez presents for video visit   Hx  recent antibiotic  ROS: See pertinent positives and negatives per HPI.  Past Medical History:  Diagnosis Date   Crohn disease (HCC)    Exercise induced bronchospasm    inhaler prn.    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    Seasonal allergies     Past Surgical History:  Procedure Laterality Date   TOOTH EXTRACTION      Family History  Problem Relation Age of Onset   Thyroid  cancer Mother    Migraines Mother    Diabetes Father    Thyroid  disease Father    Hashimoto's thyroiditis Sister    Other Sister        browns syndrome   Migraines Sister    Hashimoto's thyroiditis Sister    Seizures Sister        occipital pcs   Migraines Sister    ADD / ADHD Other        brother and father   Diabetes Other    Scoliosis Other     Social History   Tobacco Use   Smoking status: Never   Smokeless tobacco: Never  Vaping Use   Vaping status: Never Used  Substance Use Topics   Alcohol use: Not Currently   Drug use: Never      Current Outpatient Medications:    acetaminophen  (TYLENOL ) 325 MG tablet, Take by mouth., Disp: , Rfl:    albuterol  (PROVENTIL ) (2.5 MG/3ML) 0.083% nebulizer solution, TAKE (2.5 MG TOTAL) BY NEBULIZATION EVERY 4 HOURS AS NEEDED FOR WHEEZING OR SHORTNESS OF BREATH, Disp: 75 mL, Rfl: 0   albuterol  (VENTOLIN  HFA) 108 (90 Base) MCG/ACT inhaler, Inhale 2 puffs into the lungs every 6 (six)  hours as needed for wheezing or shortness of breath., Disp: 8 g, Rfl: 0   beclomethasone (QVAR  REDIHALER) 40 MCG/ACT inhaler, Inhale 1 puff into the lungs 2 (two) times daily., Disp: 1 each, Rfl: 1   Prenatal Vit-Fe Fumarate-FA (PRENATAL ONE DAILY) 27-0.8 MG TABS, , Disp: , Rfl:    PROZAC 10 MG capsule, Take 10 mg by mouth daily. (Patient not taking: Reported on 01/18/2023), Disp: , Rfl:    PROZAC 20 MG capsule, Take 20 mg by mouth daily., Disp: , Rfl:    PROZAC 40 MG capsule, Take 40 mg by mouth daily., Disp: , Rfl:   EXAM: BP Readings from Last 3 Encounters:  12/07/22 100/72  11/26/22 121/69  11/19/22 110/68    VITALS per patient if applicable:  GENERAL: alert, oriented, appears well and in no acute distress  HEENT: atraumatic, conjunttiva clear, no obvious abnormalities on inspection of external nose and ears  NECK: normal movements of the head and neck  LUNGS: on inspection no signs of respiratory distress, breathing rate appears normal, no obvious gross SOB, gasping or wheezing  CV: no obvious cyanosis  MS: moves all visible extremities without noticeable abnormality  PSYCH/NEURO: pleasant and cooperative, no obvious depression  or anxiety, speech and thought processing grossly intact Lab Results  Component Value Date   WBC 11.6 (H) 08/25/2022   HGB 14.3 08/25/2022   HCT 43.9 08/25/2022   PLT 264.0 08/25/2022   GLUCOSE 110 (H) 08/25/2022   CHOL 198 08/25/2022   TRIG 250.0 (H) 08/25/2022   HDL 41.70 08/25/2022   LDLDIRECT 135.0 08/25/2022   LDLCALC 103 (H) 08/14/2014   ALT 16 08/25/2022   AST 14 08/25/2022   NA 139 08/25/2022   K 4.2 08/25/2022   CL 101 08/25/2022   CREATININE 0.72 08/25/2022   BUN 11 08/25/2022   CO2 27 08/25/2022   TSH 1.18 08/25/2022   HGBA1C 5.5 08/25/2022    ASSESSMENT AND PLAN:  Discussed the following assessment and plan:  No diagnosis found.  Counseled.   Expectant management and discussion of plan and treatment with opportunity  to ask questions and all were answered. The patient agreed with the plan and demonstrated an understanding of the instructions.   Advised to call back or seek an in-person evaluation if worsening  or having  further concerns  in interim. No follow-ups on file.    Apolinar Eastern, MD

## 2023-02-09 ENCOUNTER — Telehealth: Payer: 59 | Admitting: Internal Medicine

## 2023-02-16 ENCOUNTER — Telehealth: Payer: 59 | Admitting: Internal Medicine

## 2023-02-16 ENCOUNTER — Ambulatory Visit (INDEPENDENT_AMBULATORY_CARE_PROVIDER_SITE_OTHER): Payer: Medicaid Other | Admitting: Family Medicine

## 2023-02-16 VITALS — BP 110/70 | HR 75 | Temp 97.8°F | Wt 185.0 lb

## 2023-02-16 DIAGNOSIS — R062 Wheezing: Secondary | ICD-10-CM | POA: Diagnosis not present

## 2023-02-16 DIAGNOSIS — K50119 Crohn's disease of large intestine with unspecified complications: Secondary | ICD-10-CM | POA: Diagnosis not present

## 2023-02-16 MED ORDER — BUDESONIDE-FORMOTEROL FUMARATE 80-4.5 MCG/ACT IN AERO: 2.0000 | INHALATION_SPRAY | Freq: Two times a day (BID) | RESPIRATORY_TRACT | 3 refills | Status: DC

## 2023-02-16 NOTE — Progress Notes (Signed)
 Established Patient Office Visit  Subjective   Patient ID: Shelly Sanchez, female    DOB: 2000/11/30  Age: 23 y.o. MRN: [id number]  Chief Complaint  Patient presents with   Shortness of Breath        Cough    Patient complains of cough, x3 months, Productive with yellow-greenish sputum,    Abdominal Pain    Patient complains of abdominal pain, x2 months    HPI   Patient has history of allergic rhinitis, exercise-induced bronchospasm, reported Crohn's disease of large intestine.  She is seen today for the following items  She states she has had cough more or less daily since October.  Is seen multiple providers in treated with multiple courses of antibiotics without resolution.  She does have history of some bronchospasm which sounds like mild intermittent asthma previously.  She has used albuterol  frequently during this time with transient improvement in cough.  Has had some intermittent productive cough.  No fever.  She had chest x-ray back in October which is unremarkable for any acute findings.  She feels like she is wheezing frequently and especially at night.  Has previously used Qvar  inhaler but had some difficulty getting this refilled.  No ACE inhibitor use.  No obvious GERD symptoms.  Not aware of any active postnasal drip symptoms.  No recent fever.  Non-smoker.  Other issue is she has had some nonspecific lower abdominal pains intermittently and bloating with occasional bright red blood per rectum.  She had previously seen gastroenterologist in [place] and was diagnosed with Crohn's colitis years ago but states she has been in remission.  She does recall taking Lialda  for some time.  Has not been on any specific medications for inflammatory bowel disease and sounds like at least a few years.  Denies any recent appetite or weight change, nausea, vomiting.  Has had occasional perianal pains but not consistently.  She basically needs to establish with new GI provider  here.  Past Medical History:  Diagnosis Date   Crohn disease (HCC)    Exercise induced bronchospasm    inhaler prn.    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 She has never used smokeless tobacco. She reports that she does not currently use alcohol. She reports that she does not use drugs. family history includes ADD / ADHD in an other family member; Diabetes in her father and another family member; Hashimoto's thyroiditis in her sister and sister; Migraines in her mother, sister, and sister; Other in her sister; Scoliosis in an other family member; Seizures in her sister; Thyroid  cancer in her mother; Thyroid  disease in her father. Allergies  Allergen Reactions   Mucinex Clear & Cool Day-Night Nausea And Vomiting and Shortness Of Breath   Azithromycin Nausea And Vomiting    Possible reaction per family Possible reaction per family    Fexofenadine Other (See Comments)    Upset stomach and heartburn    Review of Systems  Constitutional:  Negative for chills and fever.  Respiratory:  Positive for cough and wheezing. Negative for hemoptysis.   Cardiovascular:  Negative for chest pain.  Gastrointestinal:  Negative for melena, nausea and vomiting.       See HPI      Objective:  BP 110/70 (BP Location: Left Arm, Patient Position: Sitting, Cuff Size: Normal)   Pulse 75   Temp 97.8 F (36.6 C) (Oral)   Wt 185 lb (83.9 kg)   SpO2 99%   BMI 33.84 kg/m  BP Readings from Last 3 Encounters:  02/16/23 110/70  12/07/22 100/72  11/26/22 121/69   Wt Readings from Last 3 Encounters:  02/16/23 185 lb (83.9 kg)  01/18/23 186 lb (84.4 kg)  12/07/22 189 lb (85.7 kg)      Physical Exam Vitals reviewed.  Constitutional:      General: She is not in acute distress.    Appearance: She is not ill-appearing.   Cardiovascular:     Rate and Rhythm: Normal rate and regular rhythm.  Pulmonary:     Breath sounds: Wheezing present. No rales.     Comments: She has some diffuse scattered expiratory wheezes.  O2 sat 99% room air.  No retractions.  No rales. Abdominal:     Palpations: Abdomen is soft. There is no mass.     Tenderness: There is no abdominal tenderness. There is no guarding or rebound.  Neurological:     Mental Status: She is alert.      No results found for any visits on 02/16/23.    The ASCVD Risk score (Arnett DK, et al., 2019) failed to calculate for the following reasons:   The 2019 ASCVD risk score is only valid for ages 53 to 64    Assessment & Plan:   #1   48-month history of frequent cough with wheezing.  She has history of intermittent asthma but sounds like developing more persistent symptoms over the past several months.  She has had almost daily wheezing and cough now for months with no clear environmental changes or precipitants. -Discussed addition of controller medication and recommend trial of Symbicort  80 mg 2 puffs twice daily.  She is reminded to rinse mouth after use. -We reviewed recent asthma guidelines.  She is aware that she can use Symbicort  both for maintenance and rescue inhaler if needed. -Recommend follow-up with Dr. Charlett in about a month to reassess  #2 recent intermittent vague symptoms of abdominal bloating, lower abdominal pain, intermittent rectal bleeding.  History of reported Crohn's disease of large intestine.  Patient does not have any acute abdominal findings today.  Needs to establish with local GI.  Referral placed.  She knows to follow-up immediately for any obstructive symptoms or progressive symptoms   No follow-ups on file.    Wolm Scarlet, MD

## 2023-02-19 ENCOUNTER — Other Ambulatory Visit: Payer: Self-pay

## 2023-02-19 MED ORDER — ALBUTEROL SULFATE HFA 108 (90 BASE) MCG/ACT IN AERS: 2.0000 | INHALATION_SPRAY | Freq: Four times a day (QID) | RESPIRATORY_TRACT | 0 refills | Status: AC | PRN

## 2023-02-19 MED ORDER — ALBUTEROL SULFATE (2.5 MG/3ML) 0.083% IN NEBU: INHALATION_SOLUTION | RESPIRATORY_TRACT | 0 refills | Status: AC

## 2023-03-02 ENCOUNTER — Ambulatory Visit (INDEPENDENT_AMBULATORY_CARE_PROVIDER_SITE_OTHER): Payer: Medicaid Other | Admitting: Gastroenterology

## 2023-03-02 ENCOUNTER — Encounter: Payer: Self-pay | Admitting: Gastroenterology

## 2023-03-02 ENCOUNTER — Other Ambulatory Visit (INDEPENDENT_AMBULATORY_CARE_PROVIDER_SITE_OTHER): Payer: Medicaid Other

## 2023-03-02 VITALS — BP 110/70 | HR 96 | Ht 62.0 in | Wt 185.0 lb

## 2023-03-02 DIAGNOSIS — K5010 Crohn's disease of large intestine without complications: Secondary | ICD-10-CM

## 2023-03-02 DIAGNOSIS — J45909 Unspecified asthma, uncomplicated: Secondary | ICD-10-CM

## 2023-03-02 LAB — COMPREHENSIVE METABOLIC PANEL
ALT: 16 U/L (ref 0–35)
AST: 13 U/L (ref 0–37)
Albumin: 4.8 g/dL (ref 3.5–5.2)
Alkaline Phosphatase: 100 U/L (ref 39–117)
BUN: 12 mg/dL (ref 6–23)
CO2: 28 meq/L (ref 19–32)
Calcium: 9.8 mg/dL (ref 8.4–10.5)
Chloride: 102 meq/L (ref 96–112)
Creatinine, Ser: 0.59 mg/dL (ref 0.40–1.20)
GFR: 126.73 mL/min (ref >60.00)
Glucose, Bld: 101 mg/dL — ABNORMAL HIGH (ref 70–99)
Potassium: 3.6 meq/L (ref 3.5–5.1)
Sodium: 138 meq/L (ref 135–145)
Total Bilirubin: 0.3 mg/dL (ref 0.2–1.2)
Total Protein: 7.6 g/dL (ref 6.0–8.3)

## 2023-03-02 LAB — CBC WITH DIFFERENTIAL/PLATELET
Basophils Absolute: 0.1 10*3/uL (ref 0.0–0.1)
Basophils Relative: 0.5 % (ref 0.0–3.0)
Eosinophils Absolute: 0.3 10*3/uL (ref 0.0–0.7)
Eosinophils Relative: 2.2 % (ref 0.0–5.0)
HCT: 42.6 % (ref 36.0–46.0)
Hemoglobin: 13.9 g/dL (ref 12.0–15.0)
Lymphocytes Relative: 18.9 % (ref 12.0–46.0)
Lymphs Abs: 2.6 10*3/uL (ref 0.7–4.0)
MCHC: 32.6 g/dL (ref 30.0–36.0)
MCV: 84.5 fL (ref 78.0–100.0)
Monocytes Absolute: 0.7 10*3/uL (ref 0.1–1.0)
Monocytes Relative: 5.4 % (ref 3.0–12.0)
Neutro Abs: 9.9 10*3/uL — ABNORMAL HIGH (ref 1.4–7.7)
Neutrophils Relative %: 73 % (ref 43.0–77.0)
Platelets: 245 10*3/uL (ref 150.0–400.0)
RBC: 5.04 Mil/uL (ref 3.87–5.11)
RDW: 13.7 % (ref 11.5–15.5)
WBC: 13.5 10*3/uL — ABNORMAL HIGH (ref 4.0–10.5)

## 2023-03-02 LAB — HIGH SENSITIVITY CRP: CRP, High Sensitivity: 15.79 mg/L — ABNORMAL HIGH (ref 0.000–5.000)

## 2023-03-02 NOTE — Patient Instructions (Signed)
Your provider has requested that you go to the basement level for lab work before leaving today. Press "B" on the elevator. The lab is located at the first door on the left as you exit the elevator.  _______________________________________________________  If your blood pressure at your visit was 140/90 or greater, please contact your primary care physician to follow up on this.  _______________________________________________________  If you are age 24 or older, your body mass index should be between 23-30. Your Body mass index is 33.84 kg/m. If this is out of the aforementioned range listed, please consider follow up with your Primary Care Provider.  If you are age 83 or younger, your body mass index should be between 19-25. Your Body mass index is 33.84 kg/m. If this is out of the aformentioned range listed, please consider follow up with your Primary Care Provider.   ________________________________________________________  The  GI providers would like to encourage you to use [facility] to communicate with providers for non-urgent requests or questions.  Due to long hold times on the telephone, sending your provider a message by [facility] may be a faster and more efficient way to get a response.  Please allow 48 business hours for a response.  Please remember that this is for non-urgent requests.  _______________________________________________________

## 2023-03-02 NOTE — Progress Notes (Unsigned)
HPI : Shelly Sanchez is a 23 y.o. female with a history of Crohn's disease who is referred to [place] by Shelly Headings, MD.    oCTOBER, symptoms started Bronchitis, 3 courses abx Amoxillicin, cefdinir, amoxicillin  Excessive gas Blood, mucus, abdominal pain  Trying to lose weight;   Nausea, no vomiting. Decreased appetite.          Component Ref Range & Units (hover) 6 mo ago 1 yr ago 8 yr ago  CRP 1.5 24.9 High  R 0.4 Low      Shelly Sanchez is a 23 y.o. 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 Her initial symptoms were abdominal pain, constipation, and rectal bleeding and had been present for a short time. They ordered a colonoscopy as a part of the workup of the constipation and bleeding and a colonoscopy showed cecal inflammation. Biopsies were consistent with acute colitis. She was started on Prednisone acutely and Imuran for maintenance. She did not feel any better and she developed a rash on her entire body. The rectal bleeding and constipation did stop, but she continued with fatigue. The labs seemed to show improvement. 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 They stopped the Imuran at that time. After the diagnosis, she was started on Anakinra (which did not help) and Methotrexate which she continued for a few weeks after discharge. After that, she was started on Lialda. She started losing hair and felt unwell, so she stopped it after a month or two. She has been off all medication since then and has been doing well.   Currently, she is doing well. She is currently [redacted] weeks pregnant (due 10/15/21) and thinks a lot of her symptoms are related to that. She notes that prior to pregnancy, her CRP was increasing. She did have COVID in the fall and was initially getting better, but then she had recurrence of symptoms. She has 2-3 BMs per day. The stools are loose, but non-bloody.  She notes that at the beginning of pregnancy she got constipated and developed a fissure. This has resolved. She has some flank pain, but no abdominal pain. She denies urgency, tenesmus, and nocturnal BMs. She denies EIMs, but does not double vision and starts/streaks of light.   She is accompanied by her mother who provides additional history  IBD Medications: None currently   IBD Review: Extraintestinal manifestations: Arthralgias: does not have Scleritis/episcleritis: does not have Erythema nodosum: does not have Pyoderma gangrenosum: does not have PSC: does not have  Past Treatments: Steroids: has had ASA: has had AZA/6MP/MTX: has had Cyclosporine: has not had Biologics:has not had     Past Medical History:  Diagnosis Date   Crohn disease (HCC)    Exercise induced bronchospasm    inhaler prn.    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    Seasonal allergies      Past Surgical History:  Procedure Laterality Date   TOOTH EXTRACTION     Family History  Problem Relation Age of Onset   Thyroid cancer Mother    Migraines Mother    Diabetes Father    Thyroid disease Father    Hashimoto's thyroiditis Sister    Other Sister        browns syndrome   Migraines Sister    Hashimoto's thyroiditis Sister  Seizures Sister        occipital pcs   Migraines Sister    ADD / ADHD Other        brother and father   Diabetes Other    Scoliosis Other    Social History   Tobacco Use   Smoking status: Never   Smokeless tobacco: Never  Vaping Use   Vaping status: Never Used  Substance Use Topics   Alcohol use: Not Currently   Drug use: Never   Current Outpatient Medications  Medication Sig Dispense Refill   acetaminophen (TYLENOL) 325 MG tablet Take by mouth.     albuterol (PROVENTIL) (2.5 MG/3ML) 0.083% nebulizer solution TAKE (2.5 MG TOTAL) BY NEBULIZATION EVERY 4 HOURS AS NEEDED FOR WHEEZING OR SHORTNESS OF BREATH 75 mL 0    albuterol (VENTOLIN HFA) 108 (90 Base) MCG/ACT inhaler Inhale 2 puffs into the lungs every 6 (six) hours as needed for wheezing or shortness of breath. 8 g 0   budesonide-formoterol (SYMBICORT) 80-4.5 MCG/ACT inhaler Inhale 2 puffs into the lungs 2 (two) times daily. 1 each 3   Prenatal Vit-Fe Fumarate-FA (PRENATAL ONE DAILY) 27-0.8 MG TABS      PROZAC 20 MG capsule Take 20 mg by mouth daily.     PROZAC 40 MG capsule Take 40 mg by mouth daily.     No current facility-administered medications for this visit.   Allergies  Allergen Reactions   Mucinex Clear & Cool Day-Night Nausea And Vomiting and Shortness Of Breath   Azithromycin Nausea And Vomiting    Possible reaction per family Possible reaction per family    Fexofenadine Other (See Comments)    Upset stomach and heartburn     Review of Systems: All systems reviewed and negative except where noted in HPI.    No results found.  Physical Exam: There were no vitals taken for this visit. Constitutional: Pleasant,well-developed, ***female in no acute distress. HEENT: Normocephalic and atraumatic. Conjunctivae are normal. No scleral icterus. Neck supple.  Cardiovascular: Normal rate, regular rhythm.  Pulmonary/chest: Effort normal and breath sounds normal. No wheezing, rales or rhonchi. Abdominal: Soft, nondistended, nontender. Bowel sounds active throughout. There are no masses palpable. No hepatomegaly. Extremities: no edema Lymphadenopathy: No cervical adenopathy noted. Neurological: Alert and oriented to person place and time. Skin: Skin is warm and dry. No rashes noted. Psychiatric: Normal mood and affect. Behavior is normal.  CBC    Component Value Date/Time   WBC 11.6 (H) 08/25/2022 1532   RBC 5.20 (H) 08/25/2022 1532   HGB 14.3 08/25/2022 1532   HCT 43.9 08/25/2022 1532   PLT 264.0 08/25/2022 1532   MCV 84.4 08/25/2022 1532   MCHC 32.6 08/25/2022 1532   RDW 13.8 08/25/2022 1532   LYMPHSABS 3.0 08/25/2022 1532    MONOABS 0.7 08/25/2022 1532   EOSABS 0.1 08/25/2022 1532   BASOSABS 0.1 08/25/2022 1532    CMP     Component Value Date/Time   NA 139 08/25/2022 1532   NA 142 01/10/2021 0000   K 4.2 08/25/2022 1532   CL 101 08/25/2022 1532   CO2 27 08/25/2022 1532   GLUCOSE 110 (H) 08/25/2022 1532   BUN 11 08/25/2022 1532   BUN 11 01/10/2021 0000   CREATININE 0.72 08/25/2022 1532   CALCIUM 10.3 08/25/2022 1532   PROT 7.7 08/25/2022 1532   ALBUMIN 4.8 08/25/2022 1532   AST 14 08/25/2022 1532   ALT 16 08/25/2022 1532   ALKPHOS 121 (H) 08/25/2022 1532   BILITOT  0.3 08/25/2022 1532       Latest Ref Rng & Units 08/25/2022    3:32 PM 01/10/2021   12:00 AM 12/11/2020   12:00 AM  CBC EXTENDED  WBC 4.0 - 10.5 K/uL 11.6  9.1       RBC 3.87 - 5.11 Mil/uL 5.20  5.17       Hemoglobin 12.0 - 15.0 g/dL 27.2     HCT 53.6 - 64.4 % 43.9  14       Platelets 150.0 - 400.0 K/uL 264.0   237      NEUT# 1.4 - 7.7 K/uL 7.7   6.10      Lymph# 0.7 - 4.0 K/uL 3.0        This result is from an external source.      ASSESSMENT AND PLAN:  Shelly Headings, MD

## 2023-03-03 LAB — HEPATITIS B SURFACE ANTIGEN: Hepatitis B Surface Ag: NONREACTIVE

## 2023-03-03 LAB — HEPATITIS B SURFACE ANTIBODY,QUALITATIVE: Hep B S Ab: NONREACTIVE

## 2023-03-04 LAB — QUANTIFERON-TB GOLD PLUS
QuantiFERON Mitogen Value: >10 [IU]/mL
QuantiFERON Nil Value: 0.01 [IU]/mL
QuantiFERON TB1 Ag Value: 0.06 [IU]/mL
QuantiFERON TB2 Ag Value: 0.06 [IU]/mL
QuantiFERON-TB Gold Plus: NEGATIVE

## 2023-03-05 ENCOUNTER — Encounter: Payer: Self-pay | Admitting: Gastroenterology

## 2023-03-05 NOTE — Progress Notes (Signed)
Shelly Sanchez,  Your inflammatory marker was quite elevated, making a Crohn's flare very likely.  We will likely need to put you on some steroids to get the inflammation under control quickly.  Before we do that, we need to make sure you do not have an underlying C diff infection, especially given the onset of your symptoms following antibiotics.  Please submit the stool sample as soon as possible.

## 2023-03-08 ENCOUNTER — Other Ambulatory Visit: Payer: Medicaid Other

## 2023-03-08 DIAGNOSIS — K5010 Crohn's disease of large intestine without complications: Secondary | ICD-10-CM

## 2023-03-09 LAB — CLOSTRIDIUM DIFFICILE TOXIN B, QUALITATIVE, REAL-TIME PCR: Toxigenic C. Difficile by PCR: NOT DETECTED

## 2023-03-11 LAB — CALPROTECTIN, FECAL: Calprotectin, Fecal: 89 ug/g (ref 0–120)

## 2023-03-12 ENCOUNTER — Encounter: Payer: Self-pay | Admitting: Gastroenterology

## 2023-03-12 NOTE — Progress Notes (Signed)
I spoke with [name] this evening.  She reports that her symptoms are somewhat improved compared to when I saw her in the office.  She has been following a bland diet, and she thinks this may be helping.  We discussed how her lab tests were somewhat contradictory, and that her CRP was elevated, but her fecal calprotectin was completely normal.  We discussed how CRP is nonspecific and may indicate inflammation outside of the GI tract, while fecal calprotectin is very specific and sensitive for inflammation of the GI tract, particularly the colon.  Her C. difficile test was also negative.  Although we could consider starting steroids empirically, with her symptoms being slightly improved, I would not want to treat her with steroids unnecessarily.  I recommended we proceed with a colonoscopy to assess for disease activity, and if there is active Crohn's noted, then we can treat with steroids and discuss maintenance therapy.  If her colonoscopy shows completely normal colonic mucosa, then we can rest sure that her symptoms are not secondary to Crohn's, and she can continue to remain off therapy.  The patient states that her February is very full, and it will be difficult to get a colonoscopy in.  I told her that as long as her symptoms continue to be mild to improving, I think it is okay to wait until it is more convenient.  Bonita Quin, Can you please schedule a colonoscopy for Nykira, likely in early March?

## 2023-03-15 ENCOUNTER — Other Ambulatory Visit: Payer: Self-pay

## 2023-03-15 ENCOUNTER — Institutional Professional Consult (permissible substitution) (INDEPENDENT_AMBULATORY_CARE_PROVIDER_SITE_OTHER): Payer: 59

## 2023-03-15 DIAGNOSIS — K5010 Crohn's disease of large intestine without complications: Secondary | ICD-10-CM

## 2023-03-15 DIAGNOSIS — R197 Diarrhea, unspecified: Secondary | ICD-10-CM

## 2023-03-15 DIAGNOSIS — R7982 Elevated C-reactive protein (CRP): Secondary | ICD-10-CM

## 2023-03-16 ENCOUNTER — Other Ambulatory Visit: Payer: Self-pay

## 2023-03-16 DIAGNOSIS — R7982 Elevated C-reactive protein (CRP): Secondary | ICD-10-CM

## 2023-03-16 DIAGNOSIS — K5010 Crohn's disease of large intestine without complications: Secondary | ICD-10-CM

## 2023-03-16 DIAGNOSIS — R197 Diarrhea, unspecified: Secondary | ICD-10-CM

## 2023-03-16 MED ORDER — NA SULFATE-K SULFATE-MG SULF 17.5-3.13-1.6 GM/177ML PO SOLN: ORAL | 0 refills | Status: DC

## 2023-03-23 ENCOUNTER — Ambulatory Visit: Payer: Medicaid Other | Admitting: Internal Medicine

## 2023-04-16 ENCOUNTER — Encounter: Payer: Self-pay | Admitting: Gastroenterology

## 2023-04-16 ENCOUNTER — Ambulatory Visit: Payer: Medicaid Other | Admitting: Gastroenterology

## 2023-04-16 VITALS — BP 96/50 | HR 65 | Temp 97.7°F | Resp 15 | Ht 62.0 in | Wt 185.0 lb

## 2023-04-16 DIAGNOSIS — K5120 Ulcerative (chronic) proctitis without complications: Secondary | ICD-10-CM

## 2023-04-16 DIAGNOSIS — K529 Noninfective gastroenteritis and colitis, unspecified: Secondary | ICD-10-CM | POA: Diagnosis not present

## 2023-04-16 DIAGNOSIS — K50119 Crohn's disease of large intestine with unspecified complications: Secondary | ICD-10-CM

## 2023-04-16 MED ORDER — MESALAMINE 1000 MG RE SUPP: 1000.0000 mg | Freq: Every day | RECTAL | 3 refills | Status: AC

## 2023-04-16 MED ORDER — SODIUM CHLORIDE 0.9 % IV SOLN
500.0000 mL | Freq: Once | INTRAVENOUS | Status: DC
Start: 2023-04-16 — End: 2023-04-16

## 2023-04-16 NOTE — Op Note (Signed)
 [facility] Patient Name: Shelly Sanchez Procedure Date: 04/16/2023 9:06 AM MRN: [id number] Endoscopist: Lorin Picket E. Tomasa Rand , MD, [id number] Age: 23 Referring MD:  Date of Birth: 2000-07-24 Gender: Female Account #: [id number] Procedure:                Colonoscopy Indications:              Disease activity assessment of Crohn's disease of                            the colon (patient diagnosed with Crohn's colitis                            age 59 (reported isolated cecal inflammation),                            previously treated with azathioprine and                            mesalamine, off all medications for 4-5 years and                            asymptomatic until Oct 2024 (abdominal pain, loose                            stools, hematochezia, mucus) Medicines:                Monitored Anesthesia Care Procedure:                Pre-Anesthesia Assessment:                           - Prior to the procedure, a History and Physical                            was performed, and patient medications and                            allergies were reviewed. The patient's tolerance of                            previous anesthesia was also reviewed. The risks                            and benefits of the procedure and the sedation                            options and risks were discussed with the patient.                            All questions were answered, and informed consent                            was obtained. Prior Anticoagulants: The patient has  taken no anticoagulant or antiplatelet agents. ASA                            Grade Assessment: II - A patient with mild systemic                            disease. After reviewing the risks and benefits,                            the patient was deemed in satisfactory condition to                            undergo the procedure.                           After obtaining informed consent,  the colonoscope                            was passed under direct vision. Throughout the                            procedure, the patient's blood pressure, pulse, and                            oxygen saturations were monitored continuously. The                            Olympus Scope [id number] was introduced through the                            anus and advanced to the the terminal ileum, with                            identification of the appendiceal orifice and IC                            valve. The colonoscopy was performed without                            difficulty. The patient tolerated the procedure                            well. The quality of the bowel preparation was                            good. The terminal ileum, ileocecal valve,                            appendiceal orifice, and rectum were photographed. Scope In: 9:14:14 AM Scope Out: 9:29:14 AM Scope Withdrawal Time: 0 hours 10 minutes 45 seconds  Total Procedure Duration: 0 hours 15 minutes 0 seconds  Findings:                 The perianal and digital rectal examinations were  normal. Pertinent negatives include normal                            sphincter tone and no palpable rectal lesions.                           A localized area of erythematous, friable, granular                            mucosa was found in the distal rectum. The involved                            area was circumferential and about 1-2 cm in                            length. Biopsies were taken with a cold forceps for                            histology. Estimated blood loss was minimal.                           The exam was otherwise normal throughout the                            examined colon.                           Biopsies were taken with a cold forceps in the                            entire colon for histology (divided into left and                            right colon specimens).  Estimated blood loss was                            minimal.                           The terminal ileum appeared normal.                           The retroflexed view of the distal rectum and anal                            verge was normal and showed no anal or rectal                            abnormalities. Complications:            No immediate complications. Estimated Blood Loss:     Estimated blood loss was minimal. Impression:               - Erythema, friability and granularity in the  distal rectum. Biopsied. 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                           - The examined portion of the ileum was normal.                           - The distal rectum and anal verge are normal on                            retroflexion view.                           - Biopsies were taken with a cold forceps for                            histology in the entire colon. Recommendation:           - Patient has a contact number available for                            emergencies. The signs and symptoms of potential                            delayed complications were discussed with the                            patient. Return to normal activities tomorrow.                            Written discharge instructions were provided to the                            patient.                           - Resume previous diet.                           - Continue present medications.                           - Await pathology results.                           - Use Canasa 1000 mg suppository 1 per rectum QHS                            #[id number]. Kindra Bickham E. Tomasa Rand, MD 04/16/2023 9:43:19 AM This report has been signed electronically.

## 2023-04-16 NOTE — Progress Notes (Signed)
 Called to room to assist during endoscopic procedure.  Patient ID and intended procedure confirmed with present staff. Received instructions for my participation in the procedure from the performing physician.

## 2023-04-16 NOTE — Progress Notes (Signed)
 [facility] History and Physical   Primary Care Physician:  Madelin Headings, MD   Reason for Procedure:   Crohn's colitis  Plan:    Colonoscopy     HPI: Shelly Sanchez is a 23 y.o. female undergoing colonoscopy to assess disease activity of Crohn's colitis.  She was diagnosed with Crohn's at age 4 but had been off all meds for 4-5 years without symptoms until this past October when she developed abdominal pain, diarrhea with blood and mucus.  CRP elevated, but calprotectin normal.  C Diff negative.  Patient states she still continues to have crampy pain and 1-2 loose stools daily with blood and mucus.  Past Medical History:  Diagnosis Date   Allergy    Anxiety    Asthma    Crohn disease (HCC)    Depression    Exercise induced bronchospasm    inhaler prn.    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 Authorizing Provider  albuterol (VENTOLIN HFA) 108 (90 Base) MCG/ACT inhaler Inhale 2 puffs into the lungs every 6 (six) hours as needed for wheezing or shortness of breath. 02/19/23  Yes Panosh, Neta Mends, MD  PROZAC 20 MG capsule Take 20 mg by mouth daily.   Yes [provider]  PROZAC 40 MG capsule Take 40 mg by mouth daily. 12/22/21  Yes [provider]  acetaminophen (TYLENOL) 325 MG tablet Take by mouth.    [provider]  albuterol (PROVENTIL) (2.5 MG/3ML) 0.083% nebulizer solution TAKE (2.5 MG TOTAL) BY NEBULIZATION EVERY 4 HOURS AS NEEDED FOR WHEEZING OR SHORTNESS OF BREATH 02/19/23   Panosh, Neta Mends, MD  budesonide-formoterol (SYMBICORT) 80-4.5 MCG/ACT inhaler Inhale 2 puffs into the lungs 2 (two) times daily. 02/16/23   Burchette, Elberta Fortis, MD  Prenatal Vit-Fe Fumarate-FA (PRENATAL ONE DAILY) 27-0.8 MG TABS     [provider]    Current Outpatient Medications  Medication Sig Dispense Refill   albuterol (VENTOLIN HFA) 108 (90 Base) MCG/ACT inhaler Inhale 2 puffs into the lungs every 6 (six) hours as needed for wheezing or shortness of breath. 8 g 0   PROZAC 20 MG capsule Take 20 mg by mouth daily.     PROZAC 40 MG capsule Take 40 mg by mouth daily.     acetaminophen (TYLENOL) 325 MG tablet Take by mouth.     albuterol (PROVENTIL) (2.5 MG/3ML) 0.083% nebulizer solution TAKE (2.5 MG TOTAL) BY NEBULIZATION EVERY 4 HOURS AS NEEDED FOR WHEEZING OR SHORTNESS OF BREATH 75 mL 0   budesonide-formoterol (SYMBICORT) 80-4.5 MCG/ACT inhaler Inhale 2 puffs into the lungs 2 (two) times daily. 1 each 3   Prenatal Vit-Fe Fumarate-FA (PRENATAL ONE DAILY) 27-0.8 MG TABS      Current Facility-Administered Medications  Medication Dose Route Frequency Provider Last Rate Last Admin   0.9 %  sodium chloride infusion  500 mL Intravenous Once Jenel Lucks, MD        Allergies as of 04/16/2023 - Review Complete 04/16/2023  Allergen Reaction Noted   Mucinex clear & cool day-night Nausea And Vomiting and Shortness Of Breath 04/04/2021   Azithromycin Nausea And Vomiting 06/28/2017   Fexofenadine Other (See Comments) 07/15/2020    Family  History  Problem Relation Age of Onset   Thyroid cancer Mother    Migraines Mother    Diabetes Father    Thyroid disease Father    Hashimoto's thyroiditis Sister    Other Sister        browns syndrome   Migraines Sister    Hashimoto's thyroiditis Sister    Seizures Sister        occipital pcs   Migraines Sister    ADD / ADHD Other        brother and father   Diabetes Other    Scoliosis Other     Social History   Socioeconomic History   Marital status: Married    Spouse name: Not on file   Number of children: 1   Years of education: Not on file   Highest education level: Bachelor's degree (e.g., BA, AB, BS)  Occupational History   Occupation: home maker   Tobacco Use   Smoking status: Never   Smokeless tobacco: Never  Vaping Use   Vaping status: Never Used  Substance and Sexual Activity   Alcohol use: Not Currently   Drug use: Never   Sexual activity: Yes    Birth control/protection: Condom  Other Topics Concern   Not on file  Social History Narrative   Negative history of passive tobacco smoke exposure.    HH of 7   8th grade [employer]   Considering [employer].    Plays piano             Social Drivers of Health   Financial Resource Strain: Low Risk  (02/16/2023)   Overall Financial Resource Strain (CARDIA)    Difficulty of Paying Living Expenses: Not very hard  Food Insecurity: No Food Insecurity (02/16/2023)   Hunger Vital Sign    Worried About Running Out of Food in the Last Year: Never true    Ran Out of Food in the Last Year: Never true  Transportation Needs: No Transportation Needs (02/16/2023)   PRAPARE - Administrator, Civil Service (Medical): No    Lack of Transportation (Non-Medical): No  Physical Activity: Sufficiently Active (02/16/2023)   Exercise Vital Sign    Days of Exercise per Week: 7 days    Minutes of Exercise per Session: 60 min  Recent Concern: Physical Activity - Insufficiently Active (12/07/2022)   Exercise Vital Sign    Days of Exercise per Week: 7 days    Minutes of Exercise per Session: 20 min  Stress: No Stress Concern Present (02/16/2023)   [employer] of Occupational Health - Occupational Stress Questionnaire    Feeling of Stress : Only a little  Social Connections: Socially Integrated (02/16/2023)   Social Connection and Isolation Panel [NHANES]    Frequency of Communication with Friends and Family: More than three times a week    Frequency of Social Gatherings with Friends and Family: More than three times a week    Attends Religious Services: More than 4 times per year    Active Member of [employer] or Organizations: Yes    Attends Engineer, structural: More than 4 times per year     Marital Status: Married  Catering manager Violence: Not At Risk (11/19/2021)   Received from [employer], [facility]   Humiliation, Afraid, Rape, and Kick questionnaire    Fear of Current or Ex-Partner: No    Emotionally Abused: No    Physically Abused: No    Sexually Abused: No    Review of  Systems:  All other review of systems negative except as mentioned in the HPI.  Physical Exam: Vital signs BP 117/75   Pulse 78   Temp 97.7 F (36.5 C) (Skin)   Ht 5\' 2"  (1.575 m)   Wt 185 lb (83.9 kg)   LMP 04/12/2023 (Exact Date)   SpO2 100%   BMI 33.84 kg/m   General:   Alert,  Well-developed, well-nourished, pleasant and cooperative in NAD Airway:  Mallampati 2 Lungs:  Clear throughout to auscultation.   Heart:  Regular rate and rhythm; no murmurs, clicks, rubs,  or gallops. Abdomen:  Soft, nontender and nondistended. Normal bowel sounds.   Neuro/Psych:  Normal mood and affect. A and O x 3   Jaylaa Gallion E. Tomasa Rand, MD [facility]

## 2023-04-16 NOTE — Progress Notes (Signed)
 Report to PACU, RN, vss, BBS= Clear.

## 2023-04-16 NOTE — Patient Instructions (Addendum)
  Resume previous diet  Continue present medications  Awaiting pathology results  Use Canasa 1,000mg  suppository 1 per rectum at bedtime.   YOU HAD AN ENDOSCOPIC PROCEDURE TODAY AT THE [facility]:   Refer to the procedure report that was given to you for any specific questions about what was found during the examination.  If the procedure report does not answer your questions, please call your gastroenterologist to clarify.  If you requested that your care partner not be given the details of your procedure findings, then the procedure report has been included in a sealed envelope for you to review at your convenience later.  YOU SHOULD EXPECT: Some feelings of bloating in the abdomen. Passage of more gas than usual.  Walking can help get rid of the air that was put into your GI tract during the procedure and reduce the bloating. If you had a lower endoscopy (such as a colonoscopy or flexible sigmoidoscopy) you may notice spotting of blood in your stool or on the toilet paper. If you underwent a bowel prep for your procedure, you may not have a normal bowel movement for a few days.  Please Note:  You might notice some irritation and congestion in your nose or some drainage.  This is from the oxygen used during your procedure.  There is no need for concern and it should clear up in a day or so.  SYMPTOMS TO REPORT IMMEDIATELY:  Following lower endoscopy (colonoscopy or flexible sigmoidoscopy):  Excessive amounts of blood in the stool  Significant tenderness or worsening of abdominal pains  Swelling of the abdomen that is new, acute  Fever of 100F or higher  For urgent or emergent issues, a gastroenterologist can be reached at any hour by calling [phone]. Do not use MyChart messaging for urgent concerns.    DIET:  We do recommend a small meal at first, but then you may proceed to your regular diet.  Drink plenty of fluids but you should avoid alcoholic beverages for 24  hours.  ACTIVITY:  You should plan to take it easy for the rest of today and you should NOT DRIVE or use heavy machinery until tomorrow (because of the sedation medicines used during the test).    FOLLOW UP: Our staff will call the number listed on your records the next business day following your procedure.  We will call around 7:15- 8:00 am to check on you and address any questions or concerns that you may have regarding the information given to you following your procedure. If we do not reach you, we will leave a message.     If any biopsies were taken you will be contacted by phone or by letter within the next 1-3 weeks.  Please call us at [phone] if you have not heard about the biopsies in 3 weeks.    SIGNATURES/CONFIDENTIALITY: You and/or your care partner have signed paperwork which will be entered into your electronic medical record.  These signatures attest to the fact that that the information above on your After Visit Summary has been reviewed and is understood.  Full responsibility of the confidentiality of this discharge information lies with you and/or your care-partner.

## 2023-04-19 ENCOUNTER — Telehealth: Payer: Self-pay

## 2023-04-19 NOTE — Telephone Encounter (Signed)
 Left message on follow up call.

## 2023-04-20 LAB — SURGICAL PATHOLOGY

## 2023-04-24 ENCOUNTER — Encounter: Payer: Self-pay | Admitting: Gastroenterology

## 2023-04-24 NOTE — Progress Notes (Signed)
 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 The biopsies of the remainder of your colon and proximal rectum were normal.  I think you most likely have ulcerative colitis instead of Crohn's disease.  Please take the Canasa suppositories as instructed.  I think we can consider switching to an oral medication in a few months if you would prefer.  I would like to repeat a CRP (inflammatory marker) in 2 months.  Please let me know if your symptoms are not improving with the Canasa over the next few weeks.  Maya,  Please place order for CRP in 2 months.

## 2023-05-11 ENCOUNTER — Other Ambulatory Visit (HOSPITAL_COMMUNITY)
Admission: RE | Admit: 2023-05-11 | Discharge: 2023-05-11 | Disposition: A | Discharge status: Home / Self Care | Source: Ambulatory Visit | Attending: Obstetrics and Gynecology | Admitting: Obstetrics and Gynecology

## 2023-05-11 ENCOUNTER — Encounter: Payer: Self-pay | Admitting: Obstetrics and Gynecology

## 2023-05-11 ENCOUNTER — Ambulatory Visit: Payer: Medicaid Other | Admitting: Obstetrics and Gynecology

## 2023-05-11 VITALS — BP 106/71 | HR 71 | Ht 62.0 in | Wt 181.0 lb

## 2023-05-11 DIAGNOSIS — N946 Dysmenorrhea, unspecified: Secondary | ICD-10-CM | POA: Diagnosis not present

## 2023-05-11 DIAGNOSIS — Z01419 Encounter for gynecological examination (general) (routine) without abnormal findings: Secondary | ICD-10-CM

## 2023-05-11 DIAGNOSIS — Z1339 Encounter for screening examination for other mental health and behavioral disorders: Secondary | ICD-10-CM

## 2023-05-11 NOTE — Progress Notes (Signed)
 23 y.o. New GYN presents for AEX/PAP. Pt wants to tested for PCOS, planing a healthy pregnancy.  C/o painful periods 10/10 which affects her ability of daily living, periods last 7 days x 10 years.

## 2023-05-11 NOTE — Progress Notes (Signed)
 GYNECOLOGY ANNUAL PREVENTATIVE CARE ENCOUNTER NOTE  History:     Shelly Sanchez is a 23 y.o. G1P0 female here for a routine annual gynecologic exam.  Current complaints: exquisite pelvic pain primarily during menses.   Denies abnormal vaginal bleeding, discharge, problems with intercourse or other gynecologic concerns.   Pt has always had painful menses since early teens.  She occasionally notes painful intercourse with deep penetration.  Menses are regular lasting 7 days.  She does not have a history of proven endometriosis and has not had any laparoscopy.   Gynecologic History Patient's last menstrual period was 04/12/2023 (exact date). Contraception: none Last Pap: 1/23. Results were: normal with negative HPV Last mammogram: n/a  Obstetric History OB History  Gravida Para Term Preterm AB Living  1     1  SAB IAB Ectopic Multiple Live Births      1    # Outcome Date GA Lbr Len/2nd Weight Sex Type Anes PTL Lv  1 Gravida 10/06/21    F Vag-Spont   LIV    Past Medical History:  Diagnosis Date   Allergy    Anxiety    Asthma    Crohn disease (HCC)    Depression    Exercise induced bronchospasm    inhaler prn.    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    Seasonal allergies     Past Surgical History:  Procedure Laterality Date   TOOTH EXTRACTION      Current Outpatient Medications on File Prior to Visit  Medication Sig Dispense Refill   acetaminophen (TYLENOL) 325 MG tablet Take by mouth.     albuterol (PROVENTIL) (2.5 MG/3ML) 0.083% nebulizer solution TAKE (2.5 MG TOTAL) BY NEBULIZATION EVERY 4 HOURS AS NEEDED FOR WHEEZING OR SHORTNESS OF BREATH 75 mL 0   albuterol (VENTOLIN HFA) 108 (90 Base) MCG/ACT inhaler Inhale 2 puffs into the lungs every 6 (six) hours as needed for wheezing or shortness of breath. 8 g 0   budesonide-formoterol (SYMBICORT) 80-4.5 MCG/ACT inhaler Inhale 2 puffs into the lungs 2 (two) times daily. 1 each 3    mesalamine (CANASA) 1000 MG suppository Place 1 suppository (1,000 mg total) rectally at bedtime. 90 suppository 3   Prenatal Vit-Fe Fumarate-FA (PRENATAL ONE DAILY) 27-0.8 MG TABS      PROZAC 20 MG capsule Take 20 mg by mouth daily.     PROZAC 40 MG capsule Take 40 mg by mouth daily.     No current facility-administered medications on file prior to visit.    Allergies  Allergen Reactions   Mucinex Clear & Cool Day-Night Nausea And Vomiting and Shortness Of Breath   Azithromycin Nausea And Vomiting    Possible reaction per family Possible reaction per family    Fexofenadine Other (See Comments)    Upset stomach and heartburn    Social History:  reports that she has never smoked. She has never used smokeless tobacco. She reports that she does not currently use alcohol. She reports that she does not use drugs.  Family History  Problem Relation Age of Onset   Cancer Mother    Thyroid cancer Mother    Migraines Mother    Diabetes Father    Thyroid disease Father    Hashimoto's thyroiditis Sister    Other Sister        browns syndrome   Migraines Sister    Hashimoto's thyroiditis Sister  Seizures Sister        occipital pcs   Migraines Sister    Cancer Maternal Grandmother    Cancer Maternal Grandfather    Cancer Paternal Grandmother    ADD / ADHD Other        brother and father   Diabetes Other    Scoliosis Other     The following portions of the patient's history were reviewed and updated as appropriate: allergies, current medications, past family history, past medical history, past social history, past surgical history and problem list.  Review of Systems Pertinent items noted in HPI and remainder of comprehensive ROS otherwise negative.  Physical Exam:  BP 106/71   Pulse 71   Ht 5\' 2"  (1.575 m)   Wt 181 lb (82.1 kg)   LMP 04/12/2023 (Exact Date)   Breastfeeding Yes   BMI 33.11 kg/m  CONSTITUTIONAL: Well-developed, well-nourished female in no acute distress.   HENT:  Normocephalic, atraumatic, External right and left ear normal. Oropharynx is clear and moist EYES: Conjunctivae and EOM are normal. NECK: Normal range of motion, supple, no masses.  Normal thyroid.  SKIN: Skin is warm and dry. No rash noted. Not diaphoretic. No erythema. No pallor. MUSCULOSKELETAL: Normal range of motion. No tenderness.  No cyanosis, clubbing, or edema.  2+ distal pulses. NEUROLOGIC: Alert and oriented to person, place, and time. Normal reflexes, muscle tone coordination.  PSYCHIATRIC: Normal mood and affect. Normal behavior. Normal judgment and thought content. CARDIOVASCULAR: Normal heart rate noted, regular rhythm RESPIRATORY: Clear to auscultation bilaterally. Effort and breath sounds normal, no problems with respiration noted. BREASTS: Symmetric in size. No masses, tenderness, skin changes, nipple drainage, or lymphadenopathy bilaterally. Performed in the presence of a chaperone. ABDOMEN: Soft, no distention noted.  No tenderness, rebound or guarding.  PELVIC: Normal appearing external genitalia and urethral meatus; normal appearing vaginal mucosa and cervix.  No abnormal discharge noted.  Pap smear obtained.  Normal uterine size, no other palpable masses, no uterine or adnexal tenderness.  Performed in the presence of a chaperone.   Assessment and Plan:    1. Women's annual routine gynecological examination [Z01.419] (Primary) Normal annual exam - Cytology - PAP( [place])  2. Dysmenorrhea Pt has a history of crohn's disease which may now be considered ulcerative colitis.  Because of this condition, she has been asked not to take NSAIDS.  Discussed OCPs, potentially with extended regimen to address painful menses.  Pt made aware that extended regimen OCPs may have a moderate increase in intermenstrual spotting.    Since patient has been advised not to take NSAIDs, MD has proposed small rx of narcotics ie oxycodone for pain since she is debilitated during her  cycle.  Pt has been made aware this will not be a long term solution.  If pt does desire narcotics would give 24 pills/3 months with no option for refill until the three months is over.  Due to continued menstrual pain and pain with intercourse she is considering diagnostic laparoscopy.  She will discuss with spouse and call/message to schedule if needed.  Will follow up results of pap smear and manage accordingly. Routine preventative health maintenance measures emphasized. Please refer to After Visit Summary for other counseling recommendations.      Mariel Aloe, MD, FACOG Obstetrician & Gynecologist, [facility] for [facility], [facility]

## 2023-05-13 ENCOUNTER — Encounter: Payer: Self-pay | Admitting: Obstetrics and Gynecology

## 2023-05-13 LAB — CYTOLOGY - PAP
Comment: NEGATIVE
Diagnosis: NEGATIVE
High risk HPV: NEGATIVE

## 2023-06-18 ENCOUNTER — Other Ambulatory Visit: Payer: Self-pay

## 2023-06-18 ENCOUNTER — Telehealth: Payer: Self-pay

## 2023-06-18 DIAGNOSIS — R7982 Elevated C-reactive protein (CRP): Secondary | ICD-10-CM

## 2023-06-18 NOTE — Telephone Encounter (Signed)
 Contacted patient and let her know to come back for repeat labs next week.

## 2023-06-23 ENCOUNTER — Other Ambulatory Visit: Payer: Self-pay | Admitting: Family Medicine

## 2023-07-07 ENCOUNTER — Other Ambulatory Visit (INDEPENDENT_AMBULATORY_CARE_PROVIDER_SITE_OTHER)

## 2023-07-07 DIAGNOSIS — R7982 Elevated C-reactive protein (CRP): Secondary | ICD-10-CM

## 2023-07-07 LAB — C-REACTIVE PROTEIN: CRP: <1 mg/dL (ref 0.5–20.0)

## 2023-07-09 ENCOUNTER — Ambulatory Visit: Payer: Self-pay | Admitting: Gastroenterology

## 2023-07-09 NOTE — Progress Notes (Signed)
 Shelly Sanchez,  Your CRP is normal.  How are your symptoms doing now?

## 2023-08-24 ENCOUNTER — Encounter: Payer: Self-pay | Admitting: Gastroenterology

## 2023-08-25 ENCOUNTER — Other Ambulatory Visit: Payer: Self-pay | Admitting: Family Medicine

## 2023-10-19 ENCOUNTER — Ambulatory Visit: Admitting: Gastroenterology

## 2023-10-21 ENCOUNTER — Ambulatory Visit: Admitting: Gastroenterology

## 2023-10-21 ENCOUNTER — Encounter: Payer: Self-pay | Admitting: Gastroenterology

## 2023-10-21 VITALS — BP 110/76 | HR 82 | Ht 62.0 in | Wt 191.4 lb

## 2023-10-21 DIAGNOSIS — K51211 Ulcerative (chronic) proctitis with rectal bleeding: Secondary | ICD-10-CM | POA: Diagnosis not present

## 2023-10-21 DIAGNOSIS — R109 Unspecified abdominal pain: Secondary | ICD-10-CM

## 2023-10-21 DIAGNOSIS — R140 Abdominal distension (gaseous): Secondary | ICD-10-CM

## 2023-10-21 DIAGNOSIS — K5120 Ulcerative (chronic) proctitis without complications: Secondary | ICD-10-CM

## 2023-10-21 DIAGNOSIS — R1030 Lower abdominal pain, unspecified: Secondary | ICD-10-CM

## 2023-10-21 NOTE — Progress Notes (Signed)
 Discussed the use of AI scribe software for clinical note transcription with the patient, who gave verbal consent to proceed.  HPI : Shelly Sanchez is a 23 year old female with inflammatory bowel disease (initially diagnosed as Crohn's colitis in 2016, but more recent findings favor ulcerative proctitis) who presents with ongoing symptoms of rectal bleeding and gastrointestinal discomfort.  Please see summary below for prior history of inflammatory bowel disease She was initially seen by me in January of this year at which time she was having symptoms of frequent bowel movements, bloating, crampy abdominal pain and rectal bleeding.  A fecal calprotectin was obtained and was normal.  C. difficile was negative.  A high-sensitivity CRP was elevated at 15.8.  She underwent a colonoscopy in March which revealed completely normal-appearing colon and terminal ileum, except for the last 1 to 2 cm of rectum which appeared diffusely erythematous and granular.  Biopsies showed mild chronic colitis.  Biopsies of the remainder of the colon and rectum were unremarkable.  Terminal ileum was normal.  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  She completed a 90-day course of Canasa  suppositories, paused for a month, and resumed in July, but her symptoms, particularly the bleeding, remain unchanged.  She experiences persistent rectal bleeding nearly every time she has a bowel movement, occurring three to four times daily with urgency and loose stool consistency.  No nocturnal bowel movements.  No tenesmus.  The bleeding is bright red, occasionally darker, and does not correlate with dietary intake.  She experiences cramping in the lower abdomen most days, excessive gas with unusual sound and quantity, and an upset stomach upon waking. Her appetite is high, attributed to breastfeeding. She  occasionally experiences nausea, particularly after taking mesalamine .  Her weight has been stable.  She is currently taking Canasa  suppositories, inhalers, and Prozac.  She is breastfeeding her daughter and has been advised to take a prenatal vitamin, though she has not been consistent with this.      ------------------------------------------------------------------------------------------  Initial HPI (Jan 2025) Shelly Sanchez is a 23 y.o. female with a history of Crohn's colitis who is referred to us  by Panosh, Apolinar POUR, MD to establish care and further manage her Crohn's disease. The patient was most recently seen by Dr. Landy of [facility] in March 2023.  At that time, the patient was in clinical remission and she had been off all therapy for several years.  Dr. Landy ordered a fecal calprotectin, but I do not see the results of that.  The patient did not follow-up with Dr. Landy after that. Please see a clinical summary part from Dr. Adonna note discussing her history of Crohn's disease below.   Although the patient had been in clinical remission for many years, she feels that her Crohn's disease may be flaring up again.  She starting having symptoms again in October of last year.  The symptoms started in the setting of multiple courses of antibiotics for bronchitis.  She recalls being prescribed amoxicillin  and cefdinir.  While she was on these antibiotics, she developed diarrhea. She has continued to have problems with diarrhea ever since then.  She has developed the presence of blood and mucus in her stool.  She has a bowel movement anywhere from 2-5 times a day with urgency, but no incontinence and no tenesmus.  Stools are not watery, but are flaky  and poorly formed.  She reports excessive gas.  She has had crampy abdominal pain.  She has had persistent nausea, but no vomiting. She has had a mild change in appetite, with decreased interest in food, but no weight loss.   She  states that these symptoms are similar to her index symptoms when she was diagnosed, however she was having more problems with constipation then, not the diarrhea.  She was having bleeding and mucus however   She has not been tested for C. difficile yet.     -------------------------------------------------------------------   History taken from Dr. Adonna initial consult note May 01, 2021 Shelly Sanchez is a 23 y.o. 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 Her initial symptoms were abdominal pain, constipation, and rectal bleeding and had been present for a short time. They ordered a colonoscopy as a part of the workup of the constipation and bleeding and a colonoscopy showed cecal inflammation. Biopsies were consistent with acute colitis. She was started on Prednisone  acutely and Imuran for maintenance. She did not feel any better and she developed a rash on her entire body. The rectal bleeding and constipation did stop, but she continued with fatigue. The labs seemed to show improvement. 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 They stopped the Imuran at that time. After the diagnosis, she was started on Anakinra (which did not help) and Methotrexate which she continued for a few weeks after discharge. After that, she was started on Lialda . She started losing hair and felt unwell, so she stopped it after a month or two. She has been off all medication since then and has been doing well.   Currently, she is doing well. She is currently [redacted] weeks pregnant (due 10/15/21) and thinks a lot of her symptoms are related to that. She notes that prior to pregnancy, her CRP was increasing. She did have COVID in the fall and was initially getting better, but then she had recurrence of symptoms. She has 2-3 BMs per day. The stools are loose, but non-bloody. She notes that at the beginning of pregnancy she got  constipated and developed a fissure. This has resolved. She has some flank pain, but no abdominal pain. She denies urgency, tenesmus, and nocturnal BMs. She denies EIMs, but does not double vision and starts/streaks of light.   IBD Medications: Mesalamine  suppositories (started March 2025)  IBD Review: Extraintestinal manifestations: Arthralgias: does not have Scleritis/episcleritis: does not have Erythema nodosum: does not have Pyoderma gangrenosum: does not have PSC: does not have  Past Treatments: Steroids: has had ASA: has had (Lialda , caused alopecia/malaise, stopped after a month) AZA/6MP/MTX: has had (Imuranstopped because of EBV induced HLH) Cyclosporine: has not had Biologics:has not had    Colonoscopy March 2025 - Erythema, friability and granularity in the distal rectum. Biopsied. This appears more consistent with ulcerative proctitis than Crohn's colitis.  - The examined portion of the ileum was normal.  - The distal rectum and anal verge are normal on retroflexion view.  - Biopsies were taken with a cold forceps for histology in the entire colon.  FINAL DIAGNOSIS       1. Surgical [P], right colon biopsy :      UNREMARKABLE COLONIC MUCOSA.      NEGATIVE FOR ACTIVE INFLAMMATION, GRANULOMAS AND CHRONIC CHANGES.      NEGATIVE FOR DYSPLASIA.       2.  Surgical [P], left colon biopsy :      UNREMARKABLE COLONIC MUCOSA.      NEGATIVE FOR ACTIVE INFLAMMATION, GRANULOMAS AND CHRONIC CHANGES.      NEGATIVE FOR DYSPLASIA.       3. Surgical [P], rectum biopsy :      UNREMARKABLE COLONIC MUCOSA.      NEGATIVE FOR ACTIVE INFLAMMATION, GRANULOMAS AND CHRONIC CHANGES.      NEGATIVE FOR DYSPLASIA.       4. Surgical [P], distal rectum :      MILDLY ACTIVE COLITIS WITH MILD CHRONIC CHANGES.      NEGATIVE FOR GRANULOMAS AND DYSPLASIA.      DIFFERENTIAL INCLUDES CROHN'S.   Component Ref Range & Units (hover) 3 mo ago 1 yr ago 2 yr ago 9 yr ago  CRP <1.0 1.5 24.9 High  R  0.4 Low     Component Ref Range & Units 7 mo ago  Calprotectin, Fecal 0 - 120 ug/g 89   Component Ref Range & Units (hover) 7 mo ago  CRP, High Sensitivity 15.790 High    Past Medical History:  Diagnosis Date   Allergy    Anxiety    Asthma    Crohn disease (HCC)    Depression    Exercise induced bronchospasm    inhaler prn.    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    Seasonal allergies      Past Surgical History:  Procedure Laterality Date   TOOTH EXTRACTION     Family History  Problem Relation Age of Onset   Cancer Mother    Thyroid  cancer Mother    Migraines Mother    Diabetes Father    Thyroid  disease Father    Hashimoto's thyroiditis Sister    Other Sister        browns syndrome   Migraines Sister    Hashimoto's thyroiditis Sister    Seizures Sister        occipital pcs   Migraines Sister    Cancer Maternal Grandmother    Cancer Maternal Grandfather    Cancer Paternal Grandmother    ADD / ADHD Other        brother and father   Diabetes Other    Scoliosis Other    Social History   Tobacco Use   Smoking status: Never   Smokeless tobacco: Never  Vaping Use   Vaping status: Never Used  Substance Use Topics   Alcohol use: Not Currently   Drug use: Never   Current Outpatient Medications  Medication Sig Dispense Refill   acetaminophen  (TYLENOL ) 325 MG tablet Take by mouth.     albuterol  (PROVENTIL ) (2.5 MG/3ML) 0.083% nebulizer solution TAKE (2.5 MG TOTAL) BY NEBULIZATION EVERY 4 HOURS AS NEEDED FOR WHEEZING OR SHORTNESS OF BREATH 75 mL 0   albuterol  (VENTOLIN  HFA) 108 (90 Base) MCG/ACT inhaler Inhale 2 puffs into the lungs every 6 (six) hours as needed for wheezing or shortness of breath. 8 g 0   budesonide -formoterol  (SYMBICORT ) 80-4.5 MCG/ACT inhaler INHALE 2 PUFFS BY MOUTH TWICE A DAY 10.2 g 3   mesalamine  (CANASA ) 1000 MG suppository Place 1 suppository (1,000 mg total) rectally at bedtime. 90 suppository 3    Prenatal Vit-Fe Fumarate-FA (PRENATAL ONE DAILY) 27-0.8 MG TABS      PROZAC 20 MG capsule Take 20 mg by mouth daily.     PROZAC 40 MG capsule Take 40 mg by mouth  daily.     No current facility-administered medications for this visit.   Allergies  Allergen Reactions   Mucinex Clear & Cool Day-Night Nausea And Vomiting and Shortness Of Breath   Azithromycin Nausea And Vomiting    Possible reaction per family Possible reaction per family    Fexofenadine Other (See Comments)    Upset stomach and heartburn     Review of Systems: All systems reviewed and negative except where noted in HPI.    No results found.  Physical Exam: BP 110/76 (BP Location: Left Arm, Patient Position: Sitting, Cuff Size: Normal)   Pulse 82   Ht 5' 2 (1.575 m)   Wt 191 lb 6 oz (86.8 kg)   BMI 35.00 kg/m  Constitutional: Pleasant,well-developed, Caucasian female in no acute distress. HEENT: Normocephalic and atraumatic. Conjunctivae are normal. No scleral icterus. Skin: Skin is warm and dry. No rashes noted. Psychiatric: Normal mood and affect. Behavior is normal.  CBC    Component Value Date/Time   WBC 13.5 (H) 03/02/2023 1429   RBC 5.04 03/02/2023 1429   HGB 13.9 03/02/2023 1429   HCT 42.6 03/02/2023 1429   PLT 245.0 03/02/2023 1429   MCV 84.5 03/02/2023 1429   MCHC 32.6 03/02/2023 1429   RDW 13.7 03/02/2023 1429   LYMPHSABS 2.6 03/02/2023 1429   MONOABS 0.7 03/02/2023 1429   EOSABS 0.3 03/02/2023 1429   BASOSABS 0.1 03/02/2023 1429    CMP     Component Value Date/Time   NA 138 03/02/2023 1429   NA 142 01/10/2021 0000   K 3.6 03/02/2023 1429   CL 102 03/02/2023 1429   CO2 28 03/02/2023 1429   GLUCOSE 101 (H) 03/02/2023 1429   BUN 12 03/02/2023 1429   BUN 11 01/10/2021 0000   CREATININE 0.59 03/02/2023 1429   CALCIUM 9.8 03/02/2023 1429   PROT 7.6 03/02/2023 1429   ALBUMIN 4.8 03/02/2023 1429   AST 13 03/02/2023 1429   ALT 16 03/02/2023 1429   ALKPHOS 100 03/02/2023 1429    BILITOT 0.3 03/02/2023 1429       Latest Ref Rng & Units 03/02/2023    2:29 PM 08/25/2022    3:32 PM 01/10/2021   12:00 AM  CBC EXTENDED  WBC 4.0 - 10.5 K/uL 13.5  11.6  9.1      RBC 3.87 - 5.11 Mil/uL 5.04  5.20  5.17      Hemoglobin 12.0 - 15.0 g/dL 86.0  85.6    HCT 63.9 - 46.0 % 42.6  43.9  14      Platelets 150.0 - 400.0 K/uL 245.0  264.0    NEUT# 1.4 - 7.7 K/uL 9.9  7.7    Lymph# 0.7 - 4.0 K/uL 2.6  3.0       This result is from an external source.      ASSESSMENT AND PLAN:  23 year old female with inflammatory bowel disease, favor ulcerative proctitis, started on mesalamine  suppositories in March of this year, with no clinical improvement.  She continues to have symptoms of frequent bowel movements, bloating, crampy abdominal pain and hematochezia.  She has some urgency, but no tenesmus.  Her colonoscopy demonstrated only about 1 to 2 cm of mild proctitis.  We discussed how her symptoms of crampy abdominal pain, bloating and diarrhea may or may not be related to her ulcerative proctitis, and she may have concomitant irritable bowel syndrome.  These symptoms all started after getting antibiotics last year.  IBS has been thought to be precipitated  by antibiotics or other causes of gut flora disruption. She continues to have frequent hematochezia which is likely due to ongoing proctitis, given that her symptoms have not changed. I told her I would like to repeat examination of the distal colon to see if the inflammatory changes have improved, worsened or remained stable.  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 Previous colonoscopy showed mild rectal inflammation. Differential includes hemorrhoids, but none observed. Inflammation small, unlikely to cause significant GI symptoms. Discussed possible IBS contribution. - Schedule flexible sigmoidoscopy to assess rectal inflammation, no sedation. -  Continue Canasa  suppositories for now.  If inflammation not improved, we will need to consider other therapies. - Recommend daily Metamucil for stool bulk and consistency. - Discuss dietary modifications to reduce gas, such as reducing cruciferous vegetables.  Questionable irritable bowel syndrome Symptoms of bloating, gas, and cramping may be due to IBS, given normal fecal calprotectin and small rectal inflammation. Discussed dietary triggers and IBS role in symptoms. - Recommend dietary modifications to reduce gas, such as reducing cruciferous vegetables. - Recommend IBGard (peppermint oil) for smooth muscle relaxation.  Follow-Up - Schedule flexible sigmoidoscopy without sedation as per her preference. - Discuss scheduling options to accommodate her availability.  Recording duration: 16 minutes     Naureen Benton E. Stacia, MD [facility]  I spent a total of 35 minutes reviewing the patient's medical record, interviewing and examining the patient, discussing her diagnosis and management of her condition going forward, and documenting in the medical record    Panosh, Wanda K, MD

## 2023-10-21 NOTE — Patient Instructions (Signed)
 Please purchase the following medications over the counter and take as directed: Ibgard to take as directed on package. Samples have been given in the office. Also, start Metamucil daily.   You have been scheduled for a flexible sigmoidoscopy. Please follow the written instructions given to you at your visit today.  If you use inhalers (even only as needed), please bring them with you on the day of your procedure.  DO NOT TAKE 7 DAYS PRIOR TO TEST- Trulicity (dulaglutide) Ozempic, Wegovy (semaglutide) Mounjaro (tirzepatide) Bydureon Bcise (exanatide extended release)  DO NOT TAKE 1 DAY PRIOR TO YOUR TEST Rybelsus (semaglutide) Adlyxin (lixisenatide) Victoza (liraglutide) Byetta (exanatide) ___________________________________________________________________________  _______________________________________________________  If your blood pressure at your visit was 140/90 or greater, please contact your primary care physician to follow up on this.  _______________________________________________________  If you are age 24 or older, your body mass index should be between 23-30. Your Body mass index is 35 kg/m. If this is out of the aforementioned range listed, please consider follow up with your Primary Care Provider.  If you are age 24 or younger, your body mass index should be between 19-25. Your Body mass index is 35 kg/m. If this is out of the aformentioned range listed, please consider follow up with your Primary Care Provider.   ________________________________________________________  The [facility] providers would like to encourage you to use MYCHART to communicate with providers for non-urgent requests or questions.  Due to long hold times on the telephone, sending your provider a message by [facility] may be a faster and more efficient way to get a response.  Please allow 48 business hours for a response.  Please remember that this is for non-urgent requests.   _______________________________________________________  [facility] is using a team-based approach to care.  Your team is made up of your doctor and two to three APPS. Our APPS (Nurse Practitioners and Physician Assistants) work with your physician to ensure care continuity for you. They are fully qualified to address your health concerns and develop a treatment plan. They communicate directly with your gastroenterologist to care for you. Seeing the Advanced Practice Practitioners on your physician's team can help you by facilitating care more promptly, often allowing for earlier appointments, access to diagnostic testing, procedures, and other specialty referrals.

## 2023-10-22 ENCOUNTER — Other Ambulatory Visit: Payer: Self-pay | Admitting: Medical Genetics

## 2023-11-22 ENCOUNTER — Encounter: Payer: Self-pay | Admitting: Gastroenterology

## 2023-12-02 ENCOUNTER — Other Ambulatory Visit: Admitting: Gastroenterology

## 2023-12-21 ENCOUNTER — Encounter: Payer: Self-pay | Admitting: Gastroenterology

## 2023-12-29 ENCOUNTER — Other Ambulatory Visit: Payer: Self-pay

## 2023-12-29 MED ORDER — BUDESONIDE-FORMOTEROL FUMARATE 80-4.5 MCG/ACT IN AERO: 2.0000 | INHALATION_SPRAY | Freq: Two times a day (BID) | RESPIRATORY_TRACT | 3 refills | Status: DC

## 2023-12-29 NOTE — Telephone Encounter (Signed)
 Spoke to pt regarding to Symbicort  Rx refill. Inform pt about Symbicort  and that she is due for her f/u with Dr. Charlett. Ask if she would like for us  to set up an appt. Pt states not at this time and will call in January for the appt.

## 2024-01-03 ENCOUNTER — Encounter: Payer: Self-pay | Admitting: Gastroenterology

## 2024-02-25 ENCOUNTER — Other Ambulatory Visit: Payer: Self-pay | Admitting: Internal Medicine

## 2024-03-10 ENCOUNTER — Other Ambulatory Visit: Payer: Self-pay | Admitting: Medical Genetics

## 2024-03-10 DIAGNOSIS — Z006 Encounter for examination for normal comparison and control in clinical research program: Secondary | ICD-10-CM
